# Patient Record
Sex: Female | Born: 1945
Health system: Southern US, Community
[De-identification: ages and names within clinical notes are randomized; demographics above are authoritative.]

## PROBLEM LIST (undated history)

## (undated) DIAGNOSIS — F419 Anxiety disorder, unspecified: Secondary | ICD-10-CM

## (undated) DIAGNOSIS — N289 Disorder of kidney and ureter, unspecified: Secondary | ICD-10-CM

## (undated) DIAGNOSIS — R112 Nausea with vomiting, unspecified: Secondary | ICD-10-CM

## (undated) DIAGNOSIS — F319 Bipolar disorder, unspecified: Secondary | ICD-10-CM

## (undated) DIAGNOSIS — H209 Unspecified iridocyclitis: Secondary | ICD-10-CM

## (undated) DIAGNOSIS — M810 Age-related osteoporosis without current pathological fracture: Secondary | ICD-10-CM

## (undated) DIAGNOSIS — R5382 Chronic fatigue, unspecified: Secondary | ICD-10-CM

## (undated) DIAGNOSIS — R51 Headache: Secondary | ICD-10-CM

## (undated) DIAGNOSIS — K219 Gastro-esophageal reflux disease without esophagitis: Secondary | ICD-10-CM

## (undated) DIAGNOSIS — G9332 Myalgic encephalomyelitis/chronic fatigue syndrome: Secondary | ICD-10-CM

## (undated) DIAGNOSIS — Z860100 Personal history of colon polyps, unspecified: Secondary | ICD-10-CM

## (undated) DIAGNOSIS — Z9889 Other specified postprocedural states: Secondary | ICD-10-CM

## (undated) DIAGNOSIS — E785 Hyperlipidemia, unspecified: Secondary | ICD-10-CM

## (undated) DIAGNOSIS — K5909 Other constipation: Secondary | ICD-10-CM

## (undated) DIAGNOSIS — I2510 Atherosclerotic heart disease of native coronary artery without angina pectoris: Secondary | ICD-10-CM

## (undated) DIAGNOSIS — I639 Cerebral infarction, unspecified: Secondary | ICD-10-CM

## (undated) DIAGNOSIS — M48061 Spinal stenosis, lumbar region without neurogenic claudication: Secondary | ICD-10-CM

## (undated) DIAGNOSIS — I10 Essential (primary) hypertension: Secondary | ICD-10-CM

## (undated) DIAGNOSIS — M1990 Unspecified osteoarthritis, unspecified site: Secondary | ICD-10-CM

## (undated) HISTORY — PX: CARDIAC CATHETERIZATION: SHX172

## (undated) HISTORY — PX: CATARACT EXTRACTION: SUR2

## (undated) HISTORY — PX: BACK SURGERY: SHX140

## (undated) HISTORY — PX: CORONARY ANGIOPLASTY WITH STENT PLACEMENT: SHX49

## (undated) HISTORY — PX: OTHER SURGICAL HISTORY: SHX169

## (undated) HISTORY — PX: ACHILLES TENDON REPAIR: SUR1153

## (undated) HISTORY — PX: TRIGGER FINGER RELEASE: SHX641

## (undated) HISTORY — PX: ABDOMINAL HYSTERECTOMY: SHX81

## (undated) HISTORY — PX: FRACTURE SURGERY: SHX138

## (undated) HISTORY — PX: HEMORRHOID SURGERY: SHX153

---

## 2004-01-08 ENCOUNTER — Ambulatory Visit: Payer: Self-pay | Admitting: Internal Medicine

## 2004-03-22 ENCOUNTER — Emergency Department: Payer: Self-pay | Admitting: Emergency Medicine

## 2004-06-23 ENCOUNTER — Ambulatory Visit: Payer: Self-pay | Admitting: Unknown Physician Specialty

## 2005-04-22 ENCOUNTER — Ambulatory Visit: Payer: Self-pay | Admitting: Internal Medicine

## 2005-12-12 ENCOUNTER — Ambulatory Visit: Payer: Self-pay

## 2006-02-16 ENCOUNTER — Ambulatory Visit: Payer: Self-pay | Admitting: Unknown Physician Specialty

## 2006-04-26 ENCOUNTER — Ambulatory Visit: Payer: Self-pay | Admitting: Internal Medicine

## 2006-05-10 ENCOUNTER — Ambulatory Visit: Payer: Self-pay | Admitting: Unknown Physician Specialty

## 2006-09-21 ENCOUNTER — Inpatient Hospital Stay: Payer: Self-pay | Admitting: Internal Medicine

## 2006-09-21 ENCOUNTER — Other Ambulatory Visit: Payer: Self-pay

## 2006-12-31 ENCOUNTER — Emergency Department: Payer: Self-pay | Admitting: Emergency Medicine

## 2007-02-06 ENCOUNTER — Ambulatory Visit: Payer: Self-pay

## 2007-04-30 ENCOUNTER — Ambulatory Visit: Payer: Self-pay | Admitting: Internal Medicine

## 2007-05-03 ENCOUNTER — Ambulatory Visit: Payer: Self-pay | Admitting: Unknown Physician Specialty

## 2007-06-25 ENCOUNTER — Ambulatory Visit: Payer: Self-pay | Admitting: Unknown Physician Specialty

## 2007-10-02 ENCOUNTER — Ambulatory Visit: Payer: Self-pay | Admitting: Internal Medicine

## 2007-10-04 ENCOUNTER — Ambulatory Visit: Payer: Self-pay | Admitting: Internal Medicine

## 2007-10-31 ENCOUNTER — Ambulatory Visit: Payer: Self-pay | Admitting: Internal Medicine

## 2008-05-01 ENCOUNTER — Ambulatory Visit: Payer: Self-pay | Admitting: Internal Medicine

## 2008-11-08 ENCOUNTER — Ambulatory Visit: Payer: Self-pay | Admitting: Internal Medicine

## 2009-02-18 ENCOUNTER — Ambulatory Visit: Payer: Self-pay | Admitting: Unknown Physician Specialty

## 2009-05-05 ENCOUNTER — Ambulatory Visit: Payer: Self-pay | Admitting: Internal Medicine

## 2009-05-07 ENCOUNTER — Ambulatory Visit: Payer: Self-pay | Admitting: Internal Medicine

## 2010-03-28 ENCOUNTER — Ambulatory Visit: Payer: Self-pay | Admitting: Rheumatology

## 2010-05-12 ENCOUNTER — Ambulatory Visit: Payer: Self-pay | Admitting: Internal Medicine

## 2011-04-03 ENCOUNTER — Inpatient Hospital Stay: Payer: Self-pay | Admitting: *Deleted

## 2011-04-03 LAB — CBC
HCT: 37.7 % (ref 35.0–47.0)
HGB: 12.9 g/dL (ref 12.0–16.0)
MCHC: 34.2 g/dL (ref 32.0–36.0)
Platelet: 203 10*3/uL (ref 150–440)
RDW: 14.4 % (ref 11.5–14.5)

## 2011-04-03 LAB — COMPREHENSIVE METABOLIC PANEL
Albumin: 3.5 g/dL (ref 3.4–5.0)
Anion Gap: 13 (ref 7–16)
Bilirubin,Total: 0.5 mg/dL (ref 0.2–1.0)
Co2: 22 mmol/L (ref 21–32)
Creatinine: 1.57 mg/dL — ABNORMAL HIGH (ref 0.60–1.30)
EGFR (African American): 42 — ABNORMAL LOW
Potassium: 3.6 mmol/L (ref 3.5–5.1)
SGOT(AST): 32 U/L (ref 15–37)
SGPT (ALT): 20 U/L
Sodium: 142 mmol/L (ref 136–145)
Total Protein: 7.2 g/dL (ref 6.4–8.2)

## 2011-04-03 LAB — URINALYSIS, COMPLETE
Bilirubin,UR: NEGATIVE
Blood: NEGATIVE
Glucose,UR: NEGATIVE mg/dL (ref 0–75)
Nitrite: NEGATIVE
RBC,UR: 3 /HPF (ref 0–5)
Specific Gravity: 1.02 (ref 1.003–1.030)
WBC UR: 1 /HPF (ref 0–5)

## 2011-04-03 LAB — TROPONIN I: Troponin-I: <0.02 ng/mL

## 2011-04-04 LAB — BASIC METABOLIC PANEL
Anion Gap: 10 (ref 7–16)
Calcium, Total: 8.1 mg/dL — ABNORMAL LOW (ref 8.5–10.1)
Co2: 24 mmol/L (ref 21–32)
Sodium: 143 mmol/L (ref 136–145)

## 2011-04-04 LAB — CBC WITH DIFFERENTIAL/PLATELET
Basophil #: 0 10*3/uL (ref 0.0–0.1)
Basophil %: 0.5 %
Eosinophil #: 0 10*3/uL (ref 0.0–0.7)
Eosinophil %: 0.6 %
Lymphocyte #: 2.2 10*3/uL (ref 1.0–3.6)
Lymphocyte %: 48.5 %
MCV: 92 fL (ref 80–100)
Monocyte %: 12.3 %
Neutrophil #: 1.7 10*3/uL (ref 1.4–6.5)
Neutrophil %: 38.1 %
Platelet: 172 10*3/uL (ref 150–440)
RDW: 14.7 % — ABNORMAL HIGH (ref 11.5–14.5)

## 2011-04-05 LAB — BASIC METABOLIC PANEL
BUN: 18 mg/dL (ref 7–18)
Calcium, Total: 8.2 mg/dL — ABNORMAL LOW (ref 8.5–10.1)
Chloride: 112 mmol/L — ABNORMAL HIGH (ref 98–107)
Co2: 21 mmol/L (ref 21–32)
Creatinine: 1.14 mg/dL (ref 0.60–1.30)
Osmolality: 290 (ref 275–301)
Sodium: 145 mmol/L (ref 136–145)

## 2011-04-05 LAB — URINE CULTURE

## 2011-06-02 ENCOUNTER — Ambulatory Visit: Payer: Self-pay | Admitting: Internal Medicine

## 2011-11-11 ENCOUNTER — Ambulatory Visit: Payer: Self-pay | Admitting: Orthopedic Surgery

## 2012-02-23 ENCOUNTER — Other Ambulatory Visit: Payer: Self-pay | Admitting: Neurosurgery

## 2012-03-06 NOTE — Progress Notes (Addendum)
Anesthesia chart review: Patient is a 67 year old female scheduled for L5-S1 PLIF by Dr. Gerlene Fee on 03/15/2012.  Her PAT visit is scheduled for 03/08/12.   Cardiologist is Dr. Arnoldo Hooker at [facility]) who cleared patient for this procedure.  Currently available cardiology records indicate her history includes CAD s/p proximal RCA stent '08, HTN, HLD, GERD, OA, RLS.  EKG on 04/03/11 [facility]) showed NSR, low voltage QRS, cannot rule out anterior infarct (age undetermined).  Echo on 04/04/11 [facility]) showed normal left ventricular systolic function, EF 55%. Left atrium is mildly dilated. Right atrium is mildly dilated. Mild mitral regurgitation. Mild tricuspid regurgitation.   Exercise Treadmill Test [facility]) on 05/21/09 showed indeterminate treadmill EKG due to baseline EKG changes. Average exercise tolerance for age. Normal LV systolic function with EF of 69%. Normal myocardial perfusion without evidence of myocardial ischemia.    Cardiac cath on 09/21/06 [facility]) showed proximal LAD 20%, proximal RCA 70% s/p PTCA/BMS, normal LV function.  Additional preoperative testing results such as CXR and labs are pending her PAT visit. (Update 03/08/12 1450: Results noted.)  Shonna Chock, PA-C 03/06/12 1320

## 2012-03-08 ENCOUNTER — Ambulatory Visit (HOSPITAL_COMMUNITY)
Admission: RE | Admit: 2012-03-08 | Discharge: 2012-03-08 | Disposition: A | Discharge status: Home / Self Care | Payer: Medicare Other | Source: Ambulatory Visit | Attending: Anesthesiology | Admitting: Anesthesiology

## 2012-03-08 ENCOUNTER — Encounter (HOSPITAL_COMMUNITY)
Admission: RE | Admit: 2012-03-08 | Discharge: 2012-03-08 | Disposition: A | Discharge status: Home / Self Care | Payer: Medicare Other | Source: Ambulatory Visit | Attending: Neurosurgery | Admitting: Neurosurgery

## 2012-03-08 ENCOUNTER — Encounter (HOSPITAL_COMMUNITY): Payer: Self-pay

## 2012-03-08 DIAGNOSIS — Z01818 Encounter for other preprocedural examination: Secondary | ICD-10-CM | POA: Insufficient documentation

## 2012-03-08 DIAGNOSIS — M48061 Spinal stenosis, lumbar region without neurogenic claudication: Secondary | ICD-10-CM | POA: Insufficient documentation

## 2012-03-08 HISTORY — DX: Hyperlipidemia, unspecified: E78.5

## 2012-03-08 HISTORY — DX: Headache: R51

## 2012-03-08 HISTORY — DX: Other specified postprocedural states: R11.2

## 2012-03-08 HISTORY — DX: Unspecified osteoarthritis, unspecified site: M19.90

## 2012-03-08 HISTORY — DX: Gastro-esophageal reflux disease without esophagitis: K21.9

## 2012-03-08 HISTORY — DX: Anxiety disorder, unspecified: F41.9

## 2012-03-08 HISTORY — DX: Other specified postprocedural states: Z98.890

## 2012-03-08 HISTORY — DX: Atherosclerotic heart disease of native coronary artery without angina pectoris: I25.10

## 2012-03-08 LAB — CBC
HCT: 39.7 % (ref 36.0–46.0)
Hemoglobin: 13.9 g/dL (ref 12.0–15.0)
MCH: 30.5 pg (ref 26.0–34.0)
MCV: 87.1 fL (ref 78.0–100.0)
RBC: 4.56 MIL/uL (ref 3.87–5.11)
WBC: 8.6 10*3/uL (ref 4.0–10.5)

## 2012-03-08 LAB — BASIC METABOLIC PANEL
CO2: 25 mEq/L (ref 19–32)
Calcium: 10.5 mg/dL (ref 8.4–10.5)
Chloride: 105 mEq/L (ref 96–112)
Glucose, Bld: 96 mg/dL (ref 70–99)
Sodium: 141 mEq/L (ref 135–145)

## 2012-03-08 LAB — TYPE AND SCREEN

## 2012-03-08 LAB — ABO/RH: ABO/RH(D): O POS

## 2012-03-08 LAB — SURGICAL PCR SCREEN: Staphylococcus aureus: NEGATIVE

## 2012-03-08 NOTE — Pre-Procedure Instructions (Signed)
Maebelle Sulton  03/08/2012   Your procedure is scheduled on: 03-15-2912  Report to [facility] at 5:30 AM.  Call this number if you have problems the morning of surgery: [phone]   Remember:   Do not eat food or drink liquids after midnight.   Take these medicines the morning of surgery with A SIP OF WATER: Citalopram(celexa),pain medication as needed,omeprazole(Prilosec)   Do not wear jewelry, make-up or nail polish.  Do not wear lotions, powders, or perfumes. You may wear deodorant.  Do not shave 48 hours prior to surgery. Men may shave face and neck.  Do not bring valuables to the hospital.  Contacts, dentures or bridgework may not be worn into surgery.  Leave suitcase in the car. After surgery it may be brought to your room.   For patients admitted to the hospital, checkout time is 11:00 AM the day of  discharge.   Patients discharged the day of surgery will not be allowed to drive  Home.    Special Instructions: Shower using CHG 2 nights before surgery and the night before surgery.  If you shower the day of surgery use CHG.  Use special wash - you have one bottle of CHG for all showers.  You should use approximately 1/3 of the bottle for each shower.     Please read over the following fact sheets that you were given: Pain Booklet, Coughing and Deep Breathing, Blood Transfusion Information and Surgical Site Infection Prevention

## 2012-03-15 ENCOUNTER — Inpatient Hospital Stay (HOSPITAL_COMMUNITY): Payer: Medicare Other | Admitting: *Deleted

## 2012-03-15 ENCOUNTER — Encounter (HOSPITAL_COMMUNITY): Payer: Self-pay | Admitting: Vascular Surgery

## 2012-03-15 ENCOUNTER — Inpatient Hospital Stay (HOSPITAL_COMMUNITY)
Admission: RE | Admit: 2012-03-15 | Discharge: 2012-03-16 | DRG: 460 | Disposition: A | Discharge status: Home / Self Care | Payer: Medicare Other | Source: Ambulatory Visit | Attending: Neurosurgery | Admitting: Neurosurgery

## 2012-03-15 ENCOUNTER — Encounter (HOSPITAL_COMMUNITY)
Admission: RE | Disposition: A | Discharge status: Home / Self Care | Payer: Self-pay | Source: Ambulatory Visit | Attending: Neurosurgery

## 2012-03-15 ENCOUNTER — Encounter (HOSPITAL_COMMUNITY): Payer: Self-pay | Admitting: *Deleted

## 2012-03-15 ENCOUNTER — Inpatient Hospital Stay (HOSPITAL_COMMUNITY): Payer: Medicare Other

## 2012-03-15 DIAGNOSIS — M5137 Other intervertebral disc degeneration, lumbosacral region: Principal | ICD-10-CM | POA: Diagnosis present

## 2012-03-15 DIAGNOSIS — Z87891 Personal history of nicotine dependence: Secondary | ICD-10-CM

## 2012-03-15 DIAGNOSIS — M51379 Other intervertebral disc degeneration, lumbosacral region without mention of lumbar back pain or lower extremity pain: Principal | ICD-10-CM | POA: Diagnosis present

## 2012-03-15 DIAGNOSIS — M5126 Other intervertebral disc displacement, lumbar region: Secondary | ICD-10-CM | POA: Diagnosis present

## 2012-03-15 DIAGNOSIS — I2510 Atherosclerotic heart disease of native coronary artery without angina pectoris: Secondary | ICD-10-CM | POA: Diagnosis present

## 2012-03-15 DIAGNOSIS — M48061 Spinal stenosis, lumbar region without neurogenic claudication: Secondary | ICD-10-CM

## 2012-03-15 DIAGNOSIS — E785 Hyperlipidemia, unspecified: Secondary | ICD-10-CM | POA: Diagnosis present

## 2012-03-15 DIAGNOSIS — Z9861 Coronary angioplasty status: Secondary | ICD-10-CM

## 2012-03-15 DIAGNOSIS — K219 Gastro-esophageal reflux disease without esophagitis: Secondary | ICD-10-CM | POA: Diagnosis present

## 2012-03-15 DIAGNOSIS — Q762 Congenital spondylolisthesis: Secondary | ICD-10-CM

## 2012-03-15 DIAGNOSIS — F411 Generalized anxiety disorder: Secondary | ICD-10-CM | POA: Diagnosis present

## 2012-03-15 DIAGNOSIS — Z79899 Other long term (current) drug therapy: Secondary | ICD-10-CM

## 2012-03-15 SURGERY — POSTERIOR LUMBAR FUSION 1 LEVEL
Anesthesia: General | Site: Back | Wound class: Clean

## 2012-03-15 MED ORDER — METOCLOPRAMIDE HCL 5 MG/ML IJ SOLN: 10.0000 mg | Freq: Once | INTRAMUSCULAR | Status: DC | PRN

## 2012-03-15 MED ORDER — SCOPOLAMINE 1 MG/3DAYS TD PT72
MEDICATED_PATCH | TRANSDERMAL | Status: AC
  Administered 2012-03-15: 1 via TRANSDERMAL
  Filled 2012-03-15: qty 1

## 2012-03-15 MED ORDER — HYDROMORPHONE HCL PF 1 MG/ML IJ SOLN
INTRAMUSCULAR | Status: AC
  Filled 2012-03-15: qty 1

## 2012-03-15 MED ORDER — BUPIVACAINE HCL (PF) 0.5 % IJ SOLN
INTRAMUSCULAR | Status: DC | PRN
  Administered 2012-03-15: 20 mL

## 2012-03-15 MED ORDER — SODIUM CHLORIDE 0.9 % IR SOLN
Status: DC | PRN
  Administered 2012-03-15: 10:00:00

## 2012-03-15 MED ORDER — CEFAZOLIN SODIUM-DEXTROSE 2-3 GM-% IV SOLR: 2.0000 g | INTRAVENOUS | Status: DC

## 2012-03-15 MED ORDER — MIDAZOLAM HCL 5 MG/5ML IJ SOLN
INTRAMUSCULAR | Status: DC | PRN
  Administered 2012-03-15: 2 mg via INTRAVENOUS

## 2012-03-15 MED ORDER — NEOSTIGMINE METHYLSULFATE 1 MG/ML IJ SOLN
INTRAMUSCULAR | Status: DC | PRN
  Administered 2012-03-15: 5 mg via INTRAVENOUS

## 2012-03-15 MED ORDER — VANCOMYCIN HCL IN DEXTROSE 1-5 GM/200ML-% IV SOLN
1000.0000 mg | Freq: Once | INTRAVENOUS | Status: AC
  Administered 2012-03-15: 1000 mg via INTRAVENOUS
  Filled 2012-03-15: qty 200

## 2012-03-15 MED ORDER — PROPOFOL 10 MG/ML IV BOLUS
INTRAVENOUS | Status: DC | PRN
  Administered 2012-03-15: 180 mg via INTRAVENOUS

## 2012-03-15 MED ORDER — 0.9 % SODIUM CHLORIDE (POUR BTL) OPTIME
TOPICAL | Status: DC | PRN
  Administered 2012-03-15: 1000 mL

## 2012-03-15 MED ORDER — OXYCODONE HCL 5 MG PO TABS: 5.0000 mg | ORAL_TABLET | Freq: Once | ORAL | Status: DC | PRN

## 2012-03-15 MED ORDER — ONDANSETRON HCL 4 MG/2ML IJ SOLN: 4.0000 mg | INTRAMUSCULAR | Status: DC | PRN

## 2012-03-15 MED ORDER — SODIUM CHLORIDE 0.9 % IJ SOLN: 3.0000 mL | INTRAMUSCULAR | Status: DC | PRN

## 2012-03-15 MED ORDER — LACTATED RINGERS IV SOLN
INTRAVENOUS | Status: DC | PRN
  Administered 2012-03-15 (×3): via INTRAVENOUS

## 2012-03-15 MED ORDER — PHENOL 1.4 % MT LIQD: 1.0000 | OROMUCOSAL | Status: DC | PRN

## 2012-03-15 MED ORDER — SUFENTANIL CITRATE 50 MCG/ML IV SOLN
INTRAVENOUS | Status: DC | PRN
  Administered 2012-03-15 (×5): 10 ug via INTRAVENOUS

## 2012-03-15 MED ORDER — DEXAMETHASONE 4 MG PO TABS
4.0000 mg | ORAL_TABLET | Freq: Four times a day (QID) | ORAL | Status: AC
  Administered 2012-03-15 – 2012-03-16 (×2): 4 mg via ORAL
  Filled 2012-03-15 (×2): qty 1

## 2012-03-15 MED ORDER — ONDANSETRON HCL 4 MG/2ML IJ SOLN
INTRAMUSCULAR | Status: DC | PRN
  Administered 2012-03-15: 4 mg via INTRAVENOUS

## 2012-03-15 MED ORDER — LIDOCAINE HCL (CARDIAC) 20 MG/ML IV SOLN
INTRAVENOUS | Status: DC | PRN
  Administered 2012-03-15: 100 mg via INTRAVENOUS

## 2012-03-15 MED ORDER — EPHEDRINE SULFATE 50 MG/ML IJ SOLN
INTRAMUSCULAR | Status: DC | PRN
  Administered 2012-03-15: 10 mg via INTRAVENOUS

## 2012-03-15 MED ORDER — DEXAMETHASONE SODIUM PHOSPHATE 10 MG/ML IJ SOLN
INTRAMUSCULAR | Status: DC | PRN
  Administered 2012-03-15: 10 mg via INTRAVENOUS

## 2012-03-15 MED ORDER — HYDROCODONE-ACETAMINOPHEN 5-325 MG PO TABS
1.0000 | ORAL_TABLET | ORAL | Status: DC | PRN
  Administered 2012-03-15: 2 via ORAL
  Administered 2012-03-16: 1 via ORAL
  Administered 2012-03-16 (×2): 2 via ORAL
  Filled 2012-03-15 (×4): qty 2

## 2012-03-15 MED ORDER — METHOCARBAMOL 500 MG PO TABS: 500.0000 mg | ORAL_TABLET | Freq: Four times a day (QID) | ORAL | Status: DC | PRN

## 2012-03-15 MED ORDER — MENTHOL 3 MG MT LOZG: 1.0000 | LOZENGE | OROMUCOSAL | Status: DC | PRN

## 2012-03-15 MED ORDER — ACETAMINOPHEN 325 MG PO TABS: 650.0000 mg | ORAL_TABLET | ORAL | Status: DC | PRN

## 2012-03-15 MED ORDER — SODIUM CHLORIDE 0.9 % IV SOLN: 250.0000 mL | INTRAVENOUS | Status: DC

## 2012-03-15 MED ORDER — KCL IN DEXTROSE-NACL 20-5-0.45 MEQ/L-%-% IV SOLN
80.0000 mL/h | INTRAVENOUS | Status: DC
  Administered 2012-03-15: 80 mL/h via INTRAVENOUS
  Filled 2012-03-15 (×3): qty 1000

## 2012-03-15 MED ORDER — SODIUM CHLORIDE 0.9 % IV SOLN
INTRAVENOUS | Status: AC
  Filled 2012-03-15: qty 500

## 2012-03-15 MED ORDER — ACETAMINOPHEN 650 MG RE SUPP: 650.0000 mg | RECTAL | Status: DC | PRN

## 2012-03-15 MED ORDER — CITALOPRAM HYDROBROMIDE 20 MG PO TABS
20.0000 mg | ORAL_TABLET | Freq: Every day | ORAL | Status: DC
  Administered 2012-03-15 – 2012-03-16 (×2): 20 mg via ORAL
  Filled 2012-03-15 (×2): qty 1

## 2012-03-15 MED ORDER — METHOCARBAMOL 100 MG/ML IJ SOLN: 500.0000 mg | Freq: Four times a day (QID) | INTRAVENOUS | Status: DC | PRN

## 2012-03-15 MED ORDER — PANTOPRAZOLE SODIUM 40 MG IV SOLR
40.0000 mg | Freq: Every day | INTRAVENOUS | Status: DC
  Administered 2012-03-15: 40 mg via INTRAVENOUS
  Filled 2012-03-15 (×3): qty 40

## 2012-03-15 MED ORDER — GLYCOPYRROLATE 0.2 MG/ML IJ SOLN
INTRAMUSCULAR | Status: DC | PRN
  Administered 2012-03-15: 0.6 mg via INTRAVENOUS

## 2012-03-15 MED ORDER — ROCURONIUM BROMIDE 100 MG/10ML IV SOLN
INTRAVENOUS | Status: DC | PRN
  Administered 2012-03-15: 50 mg via INTRAVENOUS

## 2012-03-15 MED ORDER — METOCLOPRAMIDE HCL 5 MG/ML IJ SOLN
INTRAMUSCULAR | Status: DC | PRN
  Administered 2012-03-15: 10 mg via INTRAVENOUS

## 2012-03-15 MED ORDER — HYDROMORPHONE HCL PF 1 MG/ML IJ SOLN
0.2500 mg | INTRAMUSCULAR | Status: DC | PRN
  Administered 2012-03-15 (×4): 0.5 mg via INTRAVENOUS

## 2012-03-15 MED ORDER — ZOLPIDEM TARTRATE 5 MG PO TABS: 5.0000 mg | ORAL_TABLET | Freq: Every evening | ORAL | Status: DC | PRN

## 2012-03-15 MED ORDER — VECURONIUM BROMIDE 10 MG IV SOLR
INTRAVENOUS | Status: DC | PRN
  Administered 2012-03-15: 2 mg via INTRAVENOUS
  Administered 2012-03-15: 1 mg via INTRAVENOUS

## 2012-03-15 MED ORDER — DEXAMETHASONE SODIUM PHOSPHATE 4 MG/ML IJ SOLN: 4.0000 mg | Freq: Four times a day (QID) | INTRAMUSCULAR | Status: AC

## 2012-03-15 MED ORDER — SODIUM CHLORIDE 0.9 % IJ SOLN: 3.0000 mL | Freq: Two times a day (BID) | INTRAMUSCULAR | Status: DC

## 2012-03-15 MED ORDER — THROMBIN 20000 UNITS EX SOLR
CUTANEOUS | Status: DC | PRN
  Administered 2012-03-15: 10:00:00 via TOPICAL

## 2012-03-15 MED ORDER — SIMVASTATIN 5 MG PO TABS
5.0000 mg | ORAL_TABLET | Freq: Every day | ORAL | Status: DC
  Filled 2012-03-15: qty 1

## 2012-03-15 MED ORDER — OXYCODONE HCL 5 MG/5ML PO SOLN: 5.0000 mg | Freq: Once | ORAL | Status: DC | PRN

## 2012-03-15 MED ORDER — HYDROMORPHONE HCL PF 1 MG/ML IJ SOLN
1.0000 mg | INTRAMUSCULAR | Status: DC | PRN
  Filled 2012-03-15: qty 1

## 2012-03-15 MED ORDER — BACITRACIN 50000 UNITS IM SOLR
INTRAMUSCULAR | Status: AC
  Filled 2012-03-15: qty 1

## 2012-03-15 MED ORDER — HEMOSTATIC AGENTS (NO CHARGE) OPTIME
TOPICAL | Status: DC | PRN
  Administered 2012-03-15: 1 via TOPICAL

## 2012-03-15 SURGICAL SUPPLY — 66 items
BAG DECANTER FOR FLEXI CONT (MISCELLANEOUS) ×2 IMPLANT
BENZOIN TINCTURE PRP APPL 2/3 (GAUZE/BANDAGES/DRESSINGS) ×2 IMPLANT
BLADE SURG ROTATE 9660 (MISCELLANEOUS) IMPLANT
BONE EQUIVA 10CC (Bone Implant) ×2 IMPLANT
BRUSH SCRUB EZ PLAIN DRY (MISCELLANEOUS) ×2 IMPLANT
BUR CUTTER 7.0 ROUND (BURR) ×2 IMPLANT
BUR MATCHSTICK NEURO 3.0 LAGG (BURR) ×2 IMPLANT
CAGE TLIF ARDIS 9X9X22 (Cage) ×4 IMPLANT
CANISTER SUCTION 2500CC (MISCELLANEOUS) ×2 IMPLANT
CLOTH BEACON ORANGE TIMEOUT ST (SAFETY) ×2 IMPLANT
CONT SPEC 4OZ CLIKSEAL STRL BL (MISCELLANEOUS) ×4 IMPLANT
COVER BACK TABLE 24X17X13 BIG (DRAPES) IMPLANT
COVER TABLE BACK 60X90 (DRAPES) ×2 IMPLANT
DERMABOND ADVANCED (GAUZE/BANDAGES/DRESSINGS) ×2
DERMABOND ADVANCED .7 DNX12 (GAUZE/BANDAGES/DRESSINGS) ×2 IMPLANT
DRAPE C-ARM 42X72 X-RAY (DRAPES) ×4 IMPLANT
DRAPE LAPAROTOMY 100X72X124 (DRAPES) ×2 IMPLANT
DRAPE SURG 17X23 STRL (DRAPES) ×4 IMPLANT
DRESSING TELFA 8X3 (GAUZE/BANDAGES/DRESSINGS) ×2 IMPLANT
ELECT CAUTERY BLADE 6.4 (BLADE) ×2 IMPLANT
ELECT REM PT RETURN 9FT ADLT (ELECTROSURGICAL) ×2
ELECTRODE REM PT RTRN 9FT ADLT (ELECTROSURGICAL) ×1 IMPLANT
EVACUATOR 1/8 PVC DRAIN (DRAIN) ×2 IMPLANT
GAUZE SPONGE 4X4 16PLY XRAY LF (GAUZE/BANDAGES/DRESSINGS) IMPLANT
GLOVE BIO SURGEON STRL SZ8.5 (GLOVE) ×2 IMPLANT
GLOVE BIOGEL PI IND STRL 6.5 (GLOVE) ×3 IMPLANT
GLOVE BIOGEL PI IND STRL 7.5 (GLOVE) ×1 IMPLANT
GLOVE BIOGEL PI INDICATOR 6.5 (GLOVE) ×3
GLOVE BIOGEL PI INDICATOR 7.5 (GLOVE) ×1
GLOVE ECLIPSE 7.5 STRL STRAW (GLOVE) ×8 IMPLANT
GLOVE EXAM NITRILE LRG STRL (GLOVE) ×4 IMPLANT
GLOVE EXAM NITRILE MD LF STRL (GLOVE) IMPLANT
GLOVE EXAM NITRILE XS STR PU (GLOVE) IMPLANT
GLOVE INDICATOR 7.0 STRL GRN (GLOVE) ×2 IMPLANT
GLOVE SS BIOGEL STRL SZ 8 (GLOVE) ×1 IMPLANT
GLOVE SUPERSENSE BIOGEL SZ 8 (GLOVE) ×1
GOWN BRE IMP SLV AUR LG STRL (GOWN DISPOSABLE) IMPLANT
GOWN BRE IMP SLV AUR XL STRL (GOWN DISPOSABLE) ×8 IMPLANT
GOWN STRL REIN 2XL LVL4 (GOWN DISPOSABLE) IMPLANT
KIT BASIN OR (CUSTOM PROCEDURE TRAY) ×2 IMPLANT
KIT ROOM TURNOVER OR (KITS) ×2 IMPLANT
MILL MEDIUM DISP (BLADE) ×2 IMPLANT
NEEDLE HYPO 22GX1.5 SAFETY (NEEDLE) ×2 IMPLANT
NS IRRIG 1000ML POUR BTL (IV SOLUTION) ×2 IMPLANT
PACK LAMINECTOMY NEURO (CUSTOM PROCEDURE TRAY) ×2 IMPLANT
PAD ARMBOARD 7.5X6 YLW CONV (MISCELLANEOUS) ×6 IMPLANT
PATTIES SURGICAL .75X.75 (GAUZE/BANDAGES/DRESSINGS) IMPLANT
PENCIL BUTTON HOLSTER BLD 10FT (ELECTRODE) ×2 IMPLANT
ROD PER BENT 35MM (Rod) ×4 IMPLANT
SCREW POLY 6.5X40MM (Screw) ×6 IMPLANT
SCREW SEQUOIA 6.5X35MM (Screw) ×4 IMPLANT
SPONGE GAUZE 4X4 12PLY (GAUZE/BANDAGES/DRESSINGS) ×2 IMPLANT
SPONGE LAP 4X18 X RAY DECT (DISPOSABLE) ×2 IMPLANT
SPONGE SURGIFOAM ABS GEL 100 (HEMOSTASIS) ×2 IMPLANT
STRIP CLOSURE SKIN 1/2X4 (GAUZE/BANDAGES/DRESSINGS) ×4 IMPLANT
SUT VIC AB 0 CT1 18XCR BRD8 (SUTURE) ×1 IMPLANT
SUT VIC AB 0 CT1 8-18 (SUTURE) ×1
SUT VIC AB 2-0 OS6 18 (SUTURE) ×6 IMPLANT
SUT VIC AB 3-0 CP2 18 (SUTURE) ×2 IMPLANT
SYR 20ML ECCENTRIC (SYRINGE) ×4 IMPLANT
TOP CLSR SEQUOIA (Orthopedic Implant) ×8 IMPLANT
TOWEL OR 17X24 6PK STRL BLUE (TOWEL DISPOSABLE) ×2 IMPLANT
TOWEL OR 17X26 10 PK STRL BLUE (TOWEL DISPOSABLE) ×2 IMPLANT
TRAP SPECIMEN MUCOUS 40CC (MISCELLANEOUS) ×2 IMPLANT
TRAY FOLEY CATH 14FRSI W/METER (CATHETERS) ×2 IMPLANT
WATER STERILE IRR 1000ML POUR (IV SOLUTION) ×2 IMPLANT

## 2012-03-15 NOTE — H&P (Signed)
Madeline Cabrera is an 67 y.o. female.   Chief Complaint: Back and bilateral leg pain HPI: The patient is a 67 year old female with back and bilateral leg pain. She's tried aggressive conservative therapy which gave her no significant improvement. General imaging studies which shows spondylolisthesis and stenosis at L5-S1. After discussing the options the patient requested surgery and now comes for an L5-S1 posterior lumbar interbody fusion with pedicle screw fixation. I had a long discussion with her regarding the risks and benefits of surgical intervention. The risks discussed include but are not limited to bleeding infection weakness numbness paralysis trouble with instrumentation nonunion coma and death. We have discussed alternative methods of therapy along with the risks and benefits of nonintervention. Madeline Cabrera is had the opportunity numerous questions and appears to understand. With this information in hand she has requested we proceed with surgery.  Past Medical History  Diagnosis Date  . PONV (postoperative nausea and vomiting)   . Coronary artery disease   . Anxiety   . GERD (gastroesophageal reflux disease)   . Headache   . Arthritis   . Hyperlipidemia     Past Surgical History  Procedure Laterality Date  . Heel spurs Right   . Abdominal hysterectomy    . Trigger finger release Bilateral   . Cataract extraction Bilateral   . Cardiac catheterization    . Coronary angioplasty with stent placement      History reviewed. No pertinent family history. Social History:  reports that she quit smoking about 10 years ago. She does not have any smokeless tobacco history on file. She reports that she does not drink alcohol or use illicit drugs.  Allergies:  Allergies  Allergen Reactions  . Penicillins Rash    Medications Prior to Admission  Medication Sig Dispense Refill  . aspirin EC 81 MG tablet Take 81 mg by mouth daily.      . cholecalciferol (VITAMIN D) 1000 UNITS tablet  Take 1,000 Units by mouth daily.      . citalopram (CELEXA) 20 MG tablet Take 20 mg by mouth daily.      . DiphenhydrAMINE HCl (ALLERGY MEDICATION PO) Take 1 tablet by mouth every evening.       . estrogens, conjugated, (PREMARIN) 1.25 MG tablet Take 1.25 mg by mouth every 7 (seven) days. Patient uses this medication on Sunday nights.      Marland Kitchen HYDROcodone-acetaminophen (NORCO/VICODIN) 5-325 MG per tablet Take 0.5 tablets by mouth 3 (three) times daily as needed for pain.       . Hypromellose (GENTEAL OP) Apply 2 drops to eye 2 (two) times daily.      Marland Kitchen lovastatin (MEVACOR) 40 MG tablet Take 40 mg by mouth at bedtime.      Marland Kitchen omeprazole (PRILOSEC) 40 MG capsule Take 40 mg by mouth 2 (two) times daily.      Cliffton Asters Petrolatum-Mineral Oil (GENTEAL PM) 85-15 % OINT Apply 1 application to eye every evening.        No results found for this or any previous visit (from the past 48 hour(s)). No results found.  Review of systems not obtained due to patient factors.  Blood pressure 131/94, pulse 67, temperature 97.1 F (36.2 C), temperature source Oral, resp. rate 18, SpO2 100.00%.  The patient is awake alert and oriented. She is no facial asymmetry. Gait is slow but nonantalgic. Reflexes are decreased but equal. She is normal strength and sensation. Assessment/Plan Impression is that of spondylolisthesis and stenosis L5-S1. The plan is  for an L5-S1 posterior lumbar interbody fusion with pedicle screw fixation.  Reinaldo Meeker, MD 03/15/2012, 9:50 AM

## 2012-03-15 NOTE — Op Note (Signed)
Preop diagnosis: Degenerative disc disease and herniated disc with central spinal stenosis L5-S1 and foraminal collapse with foraminal stenosis with L5 and S1 nerve root compression Postop diagnosis: Same Procedure: Bilateral L5-S1 decompressive laminectomy for decompression of central stenosis Bilateral L5-S1 facetectomy for decompression of L5 and S1 nerve roots more so than needed for interbody fusion Bilateral L5-S1 microdiscectomy Posterior lumbar interbody fusion L5-S1 with peek interbody spacers Nonsegmental instrumentation L5-S1 with [place] pedicle screw system L5-S1 posterolateral fusion  Surgeon: Kritzer Assistant: Lovell Sheehan  After and placed the prone position the patient's back was prepped and draped in the usual sterile fashion. Localizing fluoroscopy was used prior to incision to identify the appropriate level. Midline incision was made above the spinous processes of L4-L5 and S1. Using Bovie cutting current the incision was carried on the spinous processes. Subperiosteal dissection was then carried out bilaterally on the spinous processes lamina facet joint and the far lateral region to identify the transverse process of L5 and the far lateral aspect of the S1 region. Self-retaining tract was placed for exposure and fluoroscopy showed approach the appropriate level. Spinous process of L5 and S1 removed. Starting the patient's left side generous laminotomy was performed removing the inferior 80% of the L5 lamina the medial three quarters of the facet joint the superior one third of the S1 segment. Residual bone and hypertrophic ligamentum flavum removed in a piecemeal fashion. L5 and S1 nerve roots were identified and decompressed on the side more so than needed for interbody fusion. We then decompressed the opposite side a similar fashion. We decompressed the far lateral region and did a generous facetectomy  to decompress the L5 nerve root as well as the S1 nerve root as 1 runs pedicle.  Were decompression was carried out there was the interbody fusion. This relieved the foraminal stenosis lateral recess stenosis well. At this time the disc space was entered and thoroughly cleaned out with pituitary rongeurs and curettes. It was then prepared for interbody fusion. He was distracted up to a 9 mm size which is felt to be a good fit. Appropriately sized peek interbody spacers were chosen and filled with a mixture of morselized bone and allograft. Was impacted the disc space without difficulty and fossae showed to be in good position. On the opposite side sore procedure was carried out with decortication of the endplates. We placed a mixture of morselized allograft of autologous bone deep within the interspace to help with interbody fusion then placed a second spacer. We then placed pedicle screws in standard fashion of L5 and S1 bilaterally. Which is standard entry point passed the pedicle all from lateral to medial direction through the pedicle and then tapped with a 5.5 mm tap. We placed 6.5 x 40 Miller screws at L5 and 6.5 x 35 Miller screws at S1 bilaterally. These were followed in excellent position. We then decorticated the far lateral region placed a mixture of autologous bone morselized allograft for posterolateral fusion. We then chose appropriate length rods and secured them to the top of the screws and did tightening and final tightening with torque and counter torque and compressed L5 on S1. Final fluoroscopy in AP lateral direction looked excellent. Irrigation was carried out and any bleeding control proper coagulation Gelfoam. Left an epidural drain in the epidural space and brought out through separate stab wound incision. The was then closed in multiple layers of Vicryl on the muscle fascia subcutaneous and subcuticular tissues. Dermabond Steri-Strips were placed on the skin. Shortness was then  applied the patient was extubated and taken to recovery in stable condition.

## 2012-03-15 NOTE — Preoperative (Signed)
Beta Blockers   Reason not to administer Beta Blockers:Not Applicable 

## 2012-03-15 NOTE — Transfer of Care (Signed)
Immediate Anesthesia Transfer of Care Note  Patient: Madeline Cabrera  Procedure(s) Performed: Procedure(s) with comments: POSTERIOR LUMBAR FUSION 1 LEVEL (N/A) - Lumbar five sacral one Posterior lumbar interbody fusion/Pedicle screws  Patient Location: PACU  Anesthesia Type:General  Level of Consciousness: awake  Airway & Oxygen Therapy: Patient Spontanous Breathing and Patient connected to face mask oxygen  Post-op Assessment: Report given to PACU RN and Post -op Vital signs reviewed and stable  Post vital signs: Reviewed and stable  Complications: No apparent anesthesia complications

## 2012-03-15 NOTE — Anesthesia Postprocedure Evaluation (Signed)
Anesthesia Post Note  Patient: Madeline Cabrera  Procedure(s) Performed: Procedure(s) (LRB): POSTERIOR LUMBAR FUSION 1 LEVEL (N/A)  Anesthesia type: general  Patient location: PACU  Post pain: Pain level controlled  Post assessment: Patient's Cardiovascular Status Stable  Last Vitals:  Filed Vitals:   03/15/12 1501  BP: 131/61  Pulse: 87  Temp:   Resp: 21    Post vital signs: Reviewed and stable  Level of consciousness: sedated  Complications: No apparent anesthesia complications

## 2012-03-15 NOTE — Anesthesia Preprocedure Evaluation (Addendum)
Anesthesia Evaluation  Patient identified by MRN, date of birth, ID band Patient awake    Reviewed: Allergy & Precautions, H&P , NPO status , Patient's Chart, lab work & pertinent test results, reviewed documented beta blocker date and time   History of Anesthesia Complications (+) PONV  Airway Mallampati: II TM Distance: >3 FB Neck ROM: full    Dental  (+) Teeth Intact and Dental Advisory Given   Pulmonary former smoker,  breath sounds clear to auscultation        Cardiovascular + CAD and + Cardiac Stents negative cardio ROS  Rhythm:regular     Neuro/Psych  Headaches, negative psych ROS   GI/Hepatic Neg liver ROS, GERD-  Medicated and Controlled,  Endo/Other  negative endocrine ROS  Renal/GU negative Renal ROS  negative genitourinary   Musculoskeletal   Abdominal   Peds  Hematology negative hematology ROS (+)   Anesthesia Other Findings See surgeon's H&P   Reproductive/Obstetrics negative OB ROS                          Anesthesia Physical Anesthesia Plan  ASA: III  Anesthesia Plan: General   Post-op Pain Management:    Induction: Intravenous  Airway Management Planned: Oral ETT  Additional Equipment:   Intra-op Plan:   Post-operative Plan: Extubation in OR  Informed Consent: I have reviewed the patients History and Physical, chart, labs and discussed the procedure including the risks, benefits and alternatives for the proposed anesthesia with the patient or authorized representative who has indicated his/her understanding and acceptance.   Dental Advisory Given and Dental advisory given  Plan Discussed with: CRNA, Surgeon and Anesthesiologist  Anesthesia Plan Comments:        Anesthesia Quick Evaluation

## 2012-03-15 NOTE — Anesthesia Procedure Notes (Signed)
Procedure Name: Intubation Date/Time: 03/15/2012 10:14 AM Performed by: Brien Mates DOBSON Pre-anesthesia Checklist: Patient identified, Emergency Drugs available, Suction available, Patient being monitored and Timeout performed Patient Re-evaluated:Patient Re-evaluated prior to inductionOxygen Delivery Method: Circle system utilized Preoxygenation: Pre-oxygenation with 100% oxygen Intubation Type: IV induction Ventilation: Mask ventilation without difficulty Laryngoscope Size: Miller and 2 Grade View: Grade I Tube type: Oral Tube size: 7.5 mm Number of attempts: 1 Airway Equipment and Method: Stylet Placement Confirmation: ETT inserted through vocal cords under direct vision,  breath sounds checked- equal and bilateral and positive ETCO2 Secured at: 22 (at lip) cm Tube secured with: Tape Dental Injury: Teeth and Oropharynx as per pre-operative assessment

## 2012-03-16 MED ORDER — HYDROCODONE-ACETAMINOPHEN 5-325 MG PO TABS: 1.0000 | ORAL_TABLET | ORAL | Status: DC | PRN

## 2012-03-16 MED ORDER — SENNA 8.6 MG PO TABS
1.0000 | ORAL_TABLET | Freq: Every day | ORAL | Status: DC | PRN
  Administered 2012-03-16: 8.6 mg via ORAL
  Filled 2012-03-16: qty 1

## 2012-03-16 NOTE — Discharge Summary (Signed)
Physician Discharge Summary  Patient ID: Madeline Cabrera MRN: [id number] DOB/AGE: 1945/03/10 67 y.o.  Admit date: 03/15/2012 Discharge date: 03/16/2012  Admission Diagnoses:  Discharge Diagnoses:  Active Problems:   * No active hospital problems. *   Discharged Condition: good  Hospital Course: Surgery Thursday with 1 level PLIF. Did well. Pain much better. Up without difficulty POD 1. Wound healing well. Home with specific instructions given.  Consults: None  Significant Diagnostic Studies: none  Treatments: surgery: L5 S1 plif  Discharge Exam: Blood pressure 116/53, pulse 76, temperature 97.6 F (36.4 C), temperature source Oral, resp. rate 20, height 5\' 5"  (1.651 m), weight 75 kg (165 lb 5.5 oz), SpO2 100.00%. Incision/Wound:clean and dry  Disposition: Final discharge disposition not confirmed  Discharge Orders   Future Orders Complete By Expires     Call MD for:  difficulty breathing, headache or visual disturbances  As directed     Call MD for:  hives  As directed     Call MD for:  persistant nausea and vomiting  As directed     Call MD for:  redness, tenderness, or signs of infection (pain, swelling, redness, odor or green/yellow discharge around incision site)  As directed     Call MD for:  severe uncontrolled pain  As directed     Call MD for:  temperature >100.4  As directed     Diet general  As directed     Discharge instructions  As directed     Comments:      Mostly bedrest. Get up 9 or 10 times each day and walk for 15-20 minutes each time. Very little sitting the first week. No riding in the car until your first post op appointment. If you had neck surgery...may shower from the chest down. If you had low back surgery....you may shower with a saran wrap covering over the incision. Take your pain medicine as needed...and other medicines that you are instructed to take. Call for an appointment...[phone].        Medication List    STOP taking these  medications       aspirin EC 81 MG tablet      TAKE these medications       ALLERGY MEDICATION PO  Take 1 tablet by mouth every evening.     cholecalciferol 1000 UNITS tablet  Commonly known as:  VITAMIN D  Take 1,000 Units by mouth daily.     citalopram 20 MG tablet  Commonly known as:  CELEXA  Take 20 mg by mouth daily.     estrogens (conjugated) 1.25 MG tablet  Commonly known as:  PREMARIN  Take 1.25 mg by mouth every 7 (seven) days. Patient uses this medication on Sunday nights.     GENTEAL OP  Apply 2 drops to eye 2 (two) times daily.     GenTeal PM 85-15 % Oint  Apply 1 application to eye every evening.     HYDROcodone-acetaminophen 5-325 MG per tablet  Commonly known as:  NORCO/VICODIN  Take 1-2 tablets by mouth every 4 (four) hours as needed.     HYDROcodone-acetaminophen 5-325 MG per tablet  Commonly known as:  NORCO/VICODIN  Take 0.5 tablets by mouth 3 (three) times daily as needed for pain.     lovastatin 40 MG tablet  Commonly known as:  MEVACOR  Take 40 mg by mouth at bedtime.     omeprazole 40 MG capsule  Commonly known as:  PRILOSEC  Take 40 mg by mouth  2 (two) times daily.         At home rest most of the time. Get up 9 or 10 times each day and take a 15 or 20 minute walk. No riding in the car and to your first postoperative appointment. If you have neck surgery you may shower from the chest down starting on the third postoperative day. If you had back surgery he may start showering on the third postoperative day with saran wrap wrapped around your incisional area 3 times. After the shower remove the saran wrap. Take pain medicine as needed and other medications as instructed. Call my office for an appointment.  SignedReinaldo Meeker, MD 03/16/2012, 11:31 AM

## 2012-06-06 ENCOUNTER — Ambulatory Visit: Payer: Self-pay | Admitting: Internal Medicine

## 2013-01-14 ENCOUNTER — Ambulatory Visit: Payer: Self-pay | Admitting: Unknown Physician Specialty

## 2013-05-17 ENCOUNTER — Ambulatory Visit: Payer: Self-pay | Admitting: Physical Medicine and Rehabilitation

## 2013-07-02 ENCOUNTER — Ambulatory Visit: Payer: Self-pay | Admitting: Internal Medicine

## 2013-07-24 ENCOUNTER — Ambulatory Visit: Payer: Self-pay | Admitting: Internal Medicine

## 2013-09-30 ENCOUNTER — Ambulatory Visit: Payer: Self-pay | Admitting: Specialist

## 2013-09-30 LAB — CBC WITH DIFFERENTIAL/PLATELET
Basophil #: 0.1 10*3/uL (ref 0.0–0.1)
Basophil %: 1.4 %
EOS ABS: 0.2 10*3/uL (ref 0.0–0.7)
Eosinophil %: 1.9 %
HCT: 41.5 % (ref 35.0–47.0)
HGB: 13 g/dL (ref 12.0–16.0)
LYMPHS ABS: 2.8 10*3/uL (ref 1.0–3.6)
Lymphocyte %: 31.2 %
MCH: 29.3 pg (ref 26.0–34.0)
MCHC: 31.4 g/dL — ABNORMAL LOW (ref 32.0–36.0)
MCV: 93 fL (ref 80–100)
MONO ABS: 0.7 x10 3/mm (ref 0.2–0.9)
MONOS PCT: 7.8 %
NEUTROS ABS: 5.3 10*3/uL (ref 1.4–6.5)
Neutrophil %: 57.7 %
Platelet: 249 10*3/uL (ref 150–440)
RBC: 4.45 10*6/uL (ref 3.80–5.20)
RDW: 13.8 % (ref 11.5–14.5)
WBC: 9.1 10*3/uL (ref 3.6–11.0)

## 2013-10-08 ENCOUNTER — Ambulatory Visit: Payer: Self-pay | Admitting: Specialist

## 2013-12-13 ENCOUNTER — Ambulatory Visit: Payer: Self-pay | Admitting: Specialist

## 2014-04-19 ENCOUNTER — Emergency Department
Admit: 2014-04-19 | Disposition: A | Discharge status: Home / Self Care | Payer: Self-pay | Admitting: Emergency Medicine

## 2014-04-26 NOTE — Op Note (Signed)
PATIENT NAME:  Madeline Cabrera, SIMAO MR#:  [id number] DATE OF BIRTH:  13-Sep-1945  DATE OF PROCEDURE:  10/08/2013  PREOPERATIVE DIAGNOSIS: Degenerative arthritis, carpometacarpal joint, base of left thumb.   POSTOPERATIVE DIAGNOSIS: Degenerative arthritis, carpometacarpal joint, base of left thumb.   PROCEDURE PERFORMED: Excisional trapezium arthroplasty, left thumb.   SURGEON: Christophe Louis, MD.   ANESTHESIA: General.   COMPLICATIONS: None.   TOURNIQUET TIME: 60 minutes.   DESCRIPTION OF PROCEDURE: Clindamycin 600 mg given intravenously prior to the procedure. General anesthesia is induced. The left upper extremity is thoroughly prepped with alcohol and ChloraPrep and draped in standard sterile fashion. The extremity is wrapped out with the Esmarch bandage and pneumatic tourniquet elevated to 250 mmHg. Under loupe magnification, a standard dorsal incision is made over the carpometacarpal joint of the thumb. The dissection is carefully carried down to the dorsal joint capsule and the tendon at the base and the cutaneous nerves are reflected to each side and protected throughout the case. The dorsal capsule is then incised longitudinally and reflected off the base of the first metacarpal and preserved for later closure. A knife is then used to cut the capsule surrounding the trapezium. The bur is used to remove the central portion of the trapezium. The rongeur is used to remove all of the edges. Careful palpation demonstrates no residual bone present. Three Gelfoam small sponges are then pressed into the shape of a trapezium and sewn together with 4-0 Vicryl. This is then placed into the wound in place of the trapezium and secured to the base of the wound through the flexor tendon. The wound is thoroughly irrigated multiple times throughout the case. Median nerve block and radial cutaneous sensory nerves were previously blocked using plain Marcaine. The dorsal capsule is then meticulously repaired  using 4-0 Mersilene. One 2-0 Vicryl subcutaneous suture is placed and then the skin is closed with a running subcuticular 3-0 Prolene. A soft bulky dressing with a short arm thumb splint is applied. The tourniquet is released. The patient is returned to the recovery room in satisfactory condition having tolerated the procedure quite well.   ____________________________ Lucas Mallow, MD ces:at D: 10/08/2013 10:45:30 ET T: 10/08/2013 10:59:40 ET JOB#: [id number]  cc: Lucas Mallow, MD, <Dictator> Lucas Mallow MD ELECTRONICALLY SIGNED 10/26/2013 14:57

## 2014-04-27 NOTE — Consult Note (Signed)
Minimally displaced lateral malleolus fracture which does not need surgery. Will place in equalizer brace today and patient may be weight bearing as tolerated as her pain and medical situation allows.  Electronic Signatures: Lucas Mallow (MD)  (Signed on 01-Apr-13 10:51)  Authored  Last Updated: 01-Apr-13 10:51 by Lucas Mallow (MD)

## 2014-04-27 NOTE — Consult Note (Signed)
Brief Consult Note: Diagnosis: Right Lateral Malleolus Fracture.   Comments: Patient sustained right lateral malleolus fracture after syncopal episode.  She is admitted for workup of syncope.  I have seen the xrays and discussed case with ED physician as well as Dr. Margaretmary Eddy.   She is comfortable in her splint and will be followed by Dr. Tamala Julian while in house.  No surgical indication at this time.  She is to remain non-weight bearing on the right lower extremity.  Electronic Signatures: Ria Comment (MD)  (Signed 31-Mar-13 18:36)  Authored: Brief Consult Note   Last Updated: 31-Mar-13 18:36 by Ria Comment (MD)

## 2014-04-27 NOTE — Consult Note (Signed)
General Aspect patient is a 69 year old female with history of coronary disease status post PCI and stent placement of the right coronary artery in September of 2008, history of hypertension, hyperlipidemia and valvular disease who presents for evaluation of several syncopal episode.  She had 2 similar episodes in her house palpated by right fibular fracture.  She has never had this before.  She had no chest pain or shortness of breath.  Is of note that she had a four-day history of little or no p.o. intake due to nausea and vomiting.  The time of her syncope, she was nauseated and felt very lightheaded.  Since presentation to the hospital she has had no arrhythmias.  She is rule out for myocardial infarction.  She is been carefully rehydrated is tolerating this well and feels much better.   Physical Exam:   GEN NAD    HEENT PERRL    NECK supple  No masses    RESP normal resp effort  clear BS    CARD Regular rate and rhythm  Murmur    Murmur Systolic    Systolic Murmur axilla    ABD denies tenderness  normal BS  no Abdominal Bruits    LYMPH negative neck    EXTR negative cyanosis/clubbing    SKIN normal to palpation    NEURO cranial nerves intact, motor/sensory function intact    PSYCH A+O to time, place, person   Review of Systems:   Subjective/Chief Complaint syncope    General: No Complaints    Skin: No Complaints    ENT: No Complaints    Eyes: No Complaints    Neck: No Complaints    Respiratory: No Complaints    Cardiovascular: No Complaints    Gastrointestinal: No Complaints    Genitourinary: No Complaints    Vascular: No Complaints    Musculoskeletal: Muscle or joint pain  right lower extremity discomfort secondary to fracture    Neurologic: Dizzness  Fainting    Hematologic: No Complaints    Endocrine: No Complaints    Psychiatric: No Complaints    Review of Systems: All other systems were reviewed and found to be negative     Medications/Allergies Reviewed Medications/Allergies reviewed     HTN:    GERD - Esophageal Reflux:    Osteoarthritis:    restless legs:    Colon Polyps:    chronic affective disorder with fatigue:    hysterectomy:   Home Medications: Medication Instructions Status  omeprazole 40 mg oral delayed release capsule 1 cap(s) orally once a day Active  etodolac 400 mg oral tablet 1 tab(s) orally 2 times a day Active  citalopram 20 mg oral tablet 1 tab(s) orally once a day Active  multivitamin  orally  Active  ranitidine 300 mg oral tablet 1 tab(s) orally once a day (at bedtime) Active  lovastatin 40 mg oral tablet 1 tab(s) orally once a day Active  hydrochlorothiazide-losartan 12.5 mg-50 mg oral tablet 0.5 tab(s) orally once a day (at bedtime) Active  black cohosh 1 tab(s) orally once a day (at bedtime) Active  aspirin 81 mg oral tablet 1 tab(s) orally once a day Active  etodolac 400 mg oral tablet 1 tab(s) orally 2 times a day Active   EKG:   EKG NSR    PCN: Rash    Impression patient is a 69 year old female with history of coronary artery disease status post PCI of the RCA who presented after several syncopal episode which occurred in succession at  a time when she was taking poor p.o. intake due to nausea and vomiting.  She is also on a diuretic as part of her medical regimen along with losartan.  It appears that her syncope was secondary to these drugs in the face of volume depletion.  She has had no evidence of arrhythmias since her hospitalization.  She is ruled out for myocardial infarction.  Echocardiogram read by Dr. Nehemiah Massed did not suggest significant progression of her valvular disease or a cardiomyopathy.  Carotid Dopplers were unremarkable.    Plan 1.  Continue with current medical regimen 2.  Continue with careful rehydration 3.  No further invasive or noninvasive cardiac workup required during this hospitalization.  Would consider placement of a 48-hour Holter  monitor at discharge to follow for arrhythmias.  Consideration for further outpatient workup per Dr. Nehemiah Massed can be discussed   Electronic Signatures: Teodoro Spray (MD)  (Signed 01-Apr-13 16:13)  Authored: General Aspect/Present Illness, History and Physical Exam, Review of System, Past Medical History, Home Medications, EKG , Allergies, Impression/Plan   Last Updated: 01-Apr-13 16:13 by Teodoro Spray (MD)

## 2014-04-27 NOTE — Discharge Summary (Signed)
PATIENT NAME:  Madeline Cabrera, Madeline Cabrera MR#:  [id number] DATE OF BIRTH:  25-Jan-1945  DATE OF ADMISSION:  04/03/2011 DATE OF DISCHARGE:  04/05/2011  DISCHARGE DIAGNOSES:  1. Syncope, most likely secondary to dehydration.  2. Acute renal failure, prerenal, secondary to dehydration, resolved.  3. Coronary artery disease.  4. Hypertension.  5. Hyperlipidemia.  6. Right lateral malleolar fracture.   CONSULTS:  1. Orthopedics, Dr. Christophe Louis. 2. Cardiology, Dr. Nehemiah Massed and Dr. Ubaldo Glassing.   HOSPITAL COURSE:  69 year old female with history of coronary artery disease, hypertension, hyperlipidemia, and depression. She presented with dizziness and syncope. She had two episodes of syncope on Thursday and Friday prior to coming to the hospital. She was also having poor p.o. intake for the last few days because of nausea and she has had a stomach virus. She was admitted as syncope, possibly related to dehydration, and she was also in acute renal failure when she presented. Her creatinine when she came in was 1.57 with a BUN of 28. She was taking losartan, hydrochlorothiazide at home that was stopped,  and with fluids her creatinine improved to 1.14 with a  BUN of 18. She is still mildly orthostatic. I am going to stop losartan, hydrochlorothiazide at discharge. I advised her to wear compression stockings while she is trying to get up and walk around, and remove at bedtime and when lying down. I also advised her to get up from sitiing position to a standing position   very slowly. Other work-up for syncope included a CT of the head that was negative. A CT of the cervical spine was negative for any acute abnormality. She had an ultrasound of the carotids done that did not show any evidence of hemodynamically significant stenosis. Her EKG showed some sinus rhythm, but no acute ischemic changes.  Her echocardiogram showed that the patient had normal LV function, ejection fraction of 55%, left atrium mildly dilated, mild  TR and mild MR. Her urinalysis was negative for any pyuria. Her urine culture was mixed growth. Her hemoglobin when she came in was in the range of 12.9 and remained stable. Hemoglobin was 11.3 on 04/01. She feels great. She is not complaining of any dizziness. No shortness of breath or chest pain at discharged. I discussed with Dr. Nehemiah Massed at discharge.  He is going to follow her orthostatics and blood pressure on Thursday. She has been started on an equalizer brace on the right ankle. She sustained a right lateral malleolar fracture. She has an oblique comminuted fracture in the distal portion of the right fibula with slight distraction. Dr. Tamala Julian saw her and said the patient has a minimally displaced lateral malleolar fracture that does not need surgery, and he advised an equalizer brace and weight-bearing as tolerated. Home Health physical therapy has been arranged for the patient.  I am also giving her Ultram p.r.n. for pain.   MEDICATIONS AT DISCHARGE /HOME MEDICATIONS:  1. Omeprazole 40 mg daily.  2. Etodolac 400 mg b.i.d. 3. Citalopram 20 mg daily.  4. Multivitamin. 5. Ranitidine 300 mg at bedtime. 6. Lovastatin 40 mg daily.  7. Black cohosh. 8. Aspirin 81 mg daily.  9. Ultram 50 mg p.o. q. 6 hours as needed for severe pain. 10. Wear TEDS while getting out of bed and walking. Remove at bedtime and when lying down.  11. NOTE: Do not take a losartan, hydrochlorothiazide. 12. Right ankle in equalizer brace.   DIET: Low sodium, low cholesterol diet.   ACTIVITY: PT at home. Weight-bearing  as tolerated.   CONDITION AT DISCHARGE: Comfortable. T-max 98.3, heart rate 66, blood pressure ranging from 112/73 to 119/69. Blood pressure lying down is 125/75, sitting up 119/71, and standing up 104/66.  She already received fluids for 48 hours.   FOLLOWUP:  1. Patient should follow up with Dr. Nehemiah Massed on Thursday 04/07/2011, at 10:15 a.m.   2. Follow up with Dr. Arline Asp in one or two weeks.  Follow up  BMP at Dr. Milagros Evener office.  3. Please place Holter monitor at discharge. 4. Follow up with Dr. Tamala Julian of orthopedics in two weeks for followup of right lateral malleolar fracture.  5. Home Health with physical therapy.   She remained stable on telemetry here.  A Holter monitor has been placed at the time of discharge to be read by cardiology.    TIME SPENT ON DISCHARGE:  60 minutes.   ____________________________ Mena Pauls, MD ag:bjt D: 04/05/2011 15:03:22 ET T: 04/06/2011 11:00:32 ET JOB#: [id number]  cc: Mena Pauls, MD, <Dictator> Corey Skains, MD Vianne Bulls. Arline Asp, MD Lucas Mallow, MD Mena Pauls MD ELECTRONICALLY SIGNED 04/14/2011 17:35

## 2014-04-27 NOTE — H&P (Signed)
PATIENT NAME:  Madeline Cabrera, Madeline Cabrera MR#:  [id number] DATE OF BIRTH:  November 21, 1945  DATE OF ADMISSION:  04/03/2011  PRIMARY CARE PHYSICIAN: Dr. Arline Asp ER PHYSICIAN: Dr. Liana Gerold  CHIEF COMPLAINT: Dizziness and syncope.   HISTORY OF PRESENT ILLNESS: Patient is a 69 year old female with hypertension, hyperlipidemia and history of arthritis, gastroesophageal reflux disease came in because she had passed out at home. Patient used the bathroom this morning and sat at the kitchen table and suddenly felt very dizzy and she passed out and lost consciousness, probably briefly about 2 or 3 minutes but she was on the floor approximately 30 minutes. Patient also had an episode of syncope Thursday and also Friday. Patient suffered right fibular fracture today. Patient says that she did not have any chest pain, did not have any headache and did not have any palpitations, just felt dizzy last few days and then passed out but she says that she is not eating well for past 2 or 3 days because of nausea and probably stomach virus. Patient says that her appetite is better and she ate well last night. I was asked to admit the patient because of syncope and dizziness. Patient denies any weakness in the hands or legs but just feels very fatigued and tired and that is why she felt very dizzy today.   PAST MEDICAL HISTORY: 1. Gastroesophageal reflux disease. 2. Hypertension. 3. Hyperlipidemia. 4. Depression. 5. Coronary artery disease.   SOCIAL HISTORY: No smoking, no drinking, no drugs.   PAST SURGICAL HISTORY: Sis for stent done four years ago by Dr. Nehemiah Massed.   ALLERGIES: Allergic to penicillin.   FAMILY HISTORY: Significant for mother had hypertension.   MEDICATIONS:  1. Omeprazole 40 mg daily.  2. Etodolac 400 mg p.o. b.i.d.   3. Citalopram 20 mg daily.  4. Lovastatin 40 mg at night. 5. Losartan/HCTZ 50/12.5 mg p.o. daily.  6. Black cohosh. 7. Aspirin 81 mg daily. 8. MiraLax as needed.   REVIEW OF SYSTEMS:  CONSTITUTIONAL: Feels very weak and tired for last three days due to decreased p.o. intake and nausea and also had diarrhea today two times. EYES: No blurred vision. ENT: No tinnitus. No ear pain. No epistaxis. No difficulty swallowing. RESPIRATORY: Patient has been having cough due to allergy season. No wheezing. CARDIOVASCULAR: No chest pain. No palpitations. GASTROINTESTINAL: Had nausea four days ago and diarrhea yesterday and today. No abdominal pain. Having poor appetite for the past few days and not able to eat or drink anything for the past four days. Patient says that her appetite is coming back and feels better than before. GENITOURINARY: No dysuria. ENDOCRINE: No polyuria, nocturia. HEME: No anemia. INTEGUMENTARY: No skin rashes. MUSCULOSKELETAL: Has joint pains. NEUROLOGIC: No dysarthria or ataxia. No headache. Felt very weak and tremulous this morning. No numbness. PSYCH: Has some anxiety.   PHYSICAL EXAMINATION:  VITAL SIGNS: Temperature 96.3, pulse 65, respirations 18, blood pressure 117/58, sats 98% on room air.   GENERAL: Alert, awake, oriented.   HEENT: Head atraumatic, normocephalic. Pupils are equally reacting to light. Extraocular movements are intact. ENT: No tympanic membrane congestion. No turbinate hypertrophy. No oropharyngeal erythema.   NECK: Normal range of motion. No JVD. No carotid bruit.    RESPIRATORY: Clear to auscultation. No wheeze. No rales.  CARDIOVASCULAR: S1, S2 regular. No murmurs. No bradycardia. Chest is nontender. Good pedal pulses present.   ABDOMEN: Soft, nontender, nondistended. Bowel sounds present.   MUSCULOSKELETAL: Strength 5/5 upper extremities. The lower extremity has a cast on  the right leg.   SKIN: No skin rashes.   NEUROLOGICAL: Patient has no sensory or motor deficits. Alert, awake, oriented. No dysarthria.   LYMPH NODES: No lymphadenopathy in cervical or axillary region.  LABORATORY, DIAGNOSTIC AND RADIOLOGICAL DATA: Urine: Yellow hazy  urine, leukocyte esterase negative and bacteria 1+. CT head did not show any acute abnormality. No hemorrhage or mass effect. CT of the cervical spine shows no acute cervical spine abnormality. Right ankle x-ray shows distal fibular fracture on the right side.   WBC 6.9, hemoglobin 12.9, hematocrit 37.7, platelets 203. Electrolytes: Sodium 142, potassium 3.6, chloride 107, bicarbonate 22, BUN 28, creatinine 1.57, glucose 101. Liver functions within normal limits. Troponin less than  0.02. Patient's EKG shows normal sinus rhythm with no ST-T changes, 65 beats per minute.   ASSESSMENT AND PLAN: This is a 69 year old female with:  1. Syncope multiple times with loss of consciousness. Etiology of syncope likely related to decreased p.o. intake with dehydration. Patient had weakness and decreased p.o. intake for the past few days so we are going to admit and give IV fluids. Initial CT is negative but because of the age and risk factors of hypertension and hyperlipidemia we will rule out any abnormalities by doing carotid ultrasound and echocardiogram. Patient had a recent MRI of the brain on 03/25 which did not show any acute changes, showed only chronic ischemic change. Patient's blood pressure is within normal limits and had no bradycardia to explain syncope so we are going to continue IV fluids.  2. Acute on chronic renal failure. Patient had creatinine done on 03/25 which showed 1.61, GFR of 34. Patient has chronic kidney disease but she is not aware of that. Patient's losartan/HCTZ will be held and will continue only Norvasc and check BMP in the morning.  3. Hypertension. Just continue Norvasc. 4. Gastroesophageal reflux disease. Continue PPIs.  5. Right fibula fracture due to syncope and fall. Consult orthopedic and pain management with acetaminophen or Percocet.  6. Condition is stable.   TIME SPENT: About 60 minutes on this patient admission.   ____________________________ Epifanio Lesches,  MD sk:cms D: 04/03/2011 16:29:12 ET T: 04/04/2011 05:27:42 ET JOB#: [id number]  cc: Epifanio Lesches, MD, <Dictator> Vianne Bulls. Arline Asp, MD Epifanio Lesches MD ELECTRONICALLY SIGNED 04/04/2011 8:45

## 2014-04-27 NOTE — Discharge Summary (Signed)
PATIENT NAME:  LUTISHA, KNOCHE MR#:  [id number] DATE OF BIRTH:  [birth year]  DATE OF ADMISSION:  04/03/2011 DATE OF DISCHARGE:  04/05/2011  ADDENDUM  Patient also takes Lodine which is a nonsteroidal anti-inflammatory medication, she takes as needed basis. Her kidney function has normalized now but if her kidney function worsens again the NSAIDs like Lodine can be stopped.   ____________________________ Mena Pauls, MD ag:cms D: 04/05/2011 15:20:02 ET T: 04/06/2011 10:58:01 ET JOB#: [id number]  cc: Mena Pauls, MD, <Dictator> Vianne Bulls. Arline Asp, MD Mena Pauls MD ELECTRONICALLY SIGNED 04/14/2011 17:33

## 2014-11-03 ENCOUNTER — Other Ambulatory Visit: Payer: Medicare Other

## 2014-11-07 ENCOUNTER — Encounter: Admission: RE | Payer: Self-pay | Source: Ambulatory Visit

## 2014-11-07 ENCOUNTER — Ambulatory Visit: Admission: RE | Admit: 2014-11-07 | Payer: PPO | Source: Ambulatory Visit | Admitting: Podiatry

## 2014-11-07 SURGERY — ARTHROPLASTY, TOE
Anesthesia: Choice | Laterality: Left

## 2014-11-30 ENCOUNTER — Encounter: Payer: Self-pay | Admitting: Emergency Medicine

## 2014-11-30 ENCOUNTER — Emergency Department
Admission: EM | Admit: 2014-11-30 | Discharge: 2014-11-30 | Disposition: A | Discharge status: Home / Self Care | Payer: PPO | Attending: Emergency Medicine | Admitting: Emergency Medicine

## 2014-11-30 DIAGNOSIS — M19072 Primary osteoarthritis, left ankle and foot: Secondary | ICD-10-CM | POA: Insufficient documentation

## 2014-11-30 DIAGNOSIS — Z79899 Other long term (current) drug therapy: Secondary | ICD-10-CM | POA: Insufficient documentation

## 2014-11-30 DIAGNOSIS — M160 Bilateral primary osteoarthritis of hip: Secondary | ICD-10-CM

## 2014-11-30 DIAGNOSIS — Z79890 Hormone replacement therapy (postmenopausal): Secondary | ICD-10-CM | POA: Diagnosis not present

## 2014-11-30 DIAGNOSIS — Z880 Allergy status to penicillin: Secondary | ICD-10-CM | POA: Diagnosis not present

## 2014-11-30 DIAGNOSIS — Z87891 Personal history of nicotine dependence: Secondary | ICD-10-CM | POA: Insufficient documentation

## 2014-11-30 DIAGNOSIS — M79605 Pain in left leg: Secondary | ICD-10-CM | POA: Diagnosis present

## 2014-11-30 MED ORDER — OXYCODONE-ACETAMINOPHEN 5-325 MG PO TABS
1.0000 | ORAL_TABLET | Freq: Once | ORAL | Status: AC
  Administered 2014-11-30: 1 via ORAL
  Filled 2014-11-30: qty 1

## 2014-11-30 MED ORDER — OXYCODONE-ACETAMINOPHEN 5-325 MG PO TABS: 1.0000 | ORAL_TABLET | Freq: Four times a day (QID) | ORAL | Status: DC | PRN

## 2014-11-30 NOTE — ED Notes (Signed)
Patient presents to the ED with bilateral hip, leg and foot pain x 3 weeks.  Patient states she has arthritis and regularly gets shots for the pain in her legs but her doctor was unable to make an appointment for the next few days and patient states pain is severe.  Patient is ambulatory to registration desk.  Appears uncomfortable.

## 2014-11-30 NOTE — ED Notes (Signed)
Patient c/o Bilat. Leg pain for 3 weeks, was unable to get an appointment with her MD. Now she is also having pain in both feet and lower back.

## 2014-11-30 NOTE — ED Provider Notes (Signed)
[facility] Emergency Department Provider Note  ____________________________________________  Time seen: Approximately 9:36 AM  I have reviewed the triage vital signs and the nursing notes.   HISTORY  Chief Complaint Leg Pain    HPI Madeline Cabrera is a 69 y.o. female resents emergency department for severe pain from her osteoarthritis. Patient has a known history of osteoarthritis to bilateral hips and left ankle. Patient states that she has been seen by Dr. Jefm Bryant for this condition. She states that symptoms started to increase over the past couple weeks, she made an appointment with orthopedics but states that the first available time is this coming Thursday. She states that the pain at this time as a 10 out of 10 and unbearable. She reports that she routinely receives cortisone injections into bilateral SI joints.She denies any radicular symptoms. She denies any injury precipitating this increase in pain.   Past Medical History  Diagnosis Date  . PONV (postoperative nausea and vomiting)   . Coronary artery disease   . Anxiety   . GERD (gastroesophageal reflux disease)   . Headache(784.0)   . Arthritis   . Hyperlipidemia     There are no active problems to display for this patient.   Past Surgical History  Procedure Laterality Date  . Heel spurs Right   . Abdominal hysterectomy    . Trigger finger release Bilateral   . Cataract extraction Bilateral   . Cardiac catheterization    . Coronary angioplasty with stent placement      Current Outpatient Rx  Name  Route  Sig  Dispense  Refill  . cholecalciferol (VITAMIN D) 1000 UNITS tablet   Oral   Take 1,000 Units by mouth daily.         . citalopram (CELEXA) 20 MG tablet   Oral   Take 20 mg by mouth daily.         . DiphenhydrAMINE HCl (ALLERGY MEDICATION PO)   Oral   Take 1 tablet by mouth every evening.          . estrogens, conjugated, (PREMARIN) 1.25 MG tablet   Oral   Take  1.25 mg by mouth every 7 (seven) days. Patient uses this medication on Sunday nights.         Marland Kitchen HYDROcodone-acetaminophen (NORCO/VICODIN) 5-325 MG per tablet   Oral   Take 0.5 tablets by mouth 3 (three) times daily as needed for pain.          Marland Kitchen HYDROcodone-acetaminophen (NORCO/VICODIN) 5-325 MG per tablet   Oral   Take 1-2 tablets by mouth every 4 (four) hours as needed.   50 tablet   1   . Hypromellose (GENTEAL OP)   Ophthalmic   Apply 2 drops to eye 2 (two) times daily.         Marland Kitchen lovastatin (MEVACOR) 40 MG tablet   Oral   Take 40 mg by mouth at bedtime.         Marland Kitchen omeprazole (PRILOSEC) 40 MG capsule   Oral   Take 40 mg by mouth 2 (two) times daily.         Marland Kitchen oxyCODONE-acetaminophen (ROXICET) 5-325 MG tablet   Oral   Take 1 tablet by mouth every 6 (six) hours as needed for severe pain.   20 tablet   0   . White Petrolatum-Mineral Oil (GENTEAL PM) 85-15 % OINT   Ophthalmic   Apply 1 application to eye every evening.  Allergies Penicillins  No family history on file.  Social History Social History  Substance Use Topics  . Smoking status: Former Smoker -- 0.25 packs/day for 2 years    Quit date: 01/03/2002  . Smokeless tobacco: None  . Alcohol Use: No    Review of Systems Constitutional: No fever/chills Eyes: No visual changes. ENT: No sore throat. Cardiovascular: Denies chest pain. Respiratory: Denies shortness of breath. Gastrointestinal: No abdominal pain.  No nausea, no vomiting.  No diarrhea.  No constipation. Genitourinary: Negative for dysuria. Musculoskeletal: Negative for back pain. Skin: Negative for rash. Neurological: Negative for headaches, focal weakness or numbness.  10-point ROS otherwise negative.  ____________________________________________   PHYSICAL EXAM:  VITAL SIGNS: ED Triage Vitals  Enc Vitals Group     BP 11/30/14 0928 126/80 mmHg     Pulse Rate 11/30/14 0928 81     Resp 11/30/14 0928 18     Temp  11/30/14 0928 98.3 F (36.8 C)     Temp src --      SpO2 11/30/14 0928 99 %     Weight 11/30/14 0928 165 lb (74.844 kg)     Height 11/30/14 0928 5' (1.524 m)     Head Cir --      Peak Flow --      Pain Score 11/30/14 0928 8     Pain Loc --      Pain Edu? --      Excl. in [place]? --     Constitutional: Alert and oriented. Well appearing and in no acute distress. Eyes: Conjunctivae are normal. PERRL. EOMI. Head: Atraumatic. Nose: No congestion/rhinnorhea. Mouth/Throat: Mucous membranes are moist.  Oropharynx non-erythematous. Neck: No stridor.   Cardiovascular: Normal rate, regular rhythm. Grossly normal heart sounds.  Good peripheral circulation. Respiratory: Normal respiratory effort.  No retractions. Lungs CTAB. Gastrointestinal: Soft and nontender. No distention. No abdominal bruits. No CVA tenderness. Musculoskeletal: No lower extremity tenderness nor edema.  No joint effusions. No visible deformity to inspection. No abrasion, contusion, hematoma, ecchymosis noted. Patient is tender to palpation over bilateral SI joints. Patient does have full range of motion to bilateral hips. Pulses and sensation are intact distally. Neurologic:  Normal speech and language. No gross focal neurologic deficits are appreciated. No gait instability. Skin:  Skin is warm, dry and intact. No rash noted. Psychiatric: Mood and affect are normal. Speech and behavior are normal.  ____________________________________________   LABS (all labs ordered are listed, but only abnormal results are displayed)  Labs Reviewed - No data to display ____________________________________________  EKG   ____________________________________________  RADIOLOGY   ____________________________________________   PROCEDURES  Procedure(s) performed: None  Critical Care performed: No  ____________________________________________   INITIAL IMPRESSION / ASSESSMENT AND PLAN / ED COURSE  Pertinent labs & imaging  results that were available during my care of the patient were reviewed by me and considered in my medical decision making (see chart for details).  Agents history, symptoms, physical exam are consistent with a known osteoarthritis. I advised patient that we would not provide cortisone injections into the SI joint here in the emergency department. I advised patient to place her on narcotics for pain control and that she is to follow-up with orthopedics on Thursday for her injections. Patient verbalizes understanding of her diagnosis and treatment plan and verbalizes compliance with same. Patient is stage III kidney disease and I will not place patient on anti-inflammatories at this time. ____________________________________________   FINAL CLINICAL IMPRESSION(S) / ED DIAGNOSES  Final diagnoses:  Osteoarthritis of both hips, unspecified osteoarthritis type  Osteoarthritis of left ankle, unspecified osteoarthritis type      Darletta Moll, PA-C 11/30/14 [place], MD 11/30/14 [phone]

## 2014-11-30 NOTE — Discharge Instructions (Signed)

## 2014-12-24 ENCOUNTER — Encounter
Admission: RE | Admit: 2014-12-24 | Discharge: 2014-12-24 | Disposition: A | Discharge status: Home / Self Care | Payer: PPO | Source: Ambulatory Visit | Attending: Podiatry | Admitting: Podiatry

## 2014-12-24 ENCOUNTER — Ambulatory Visit
Admission: RE | Admit: 2014-12-24 | Discharge: 2014-12-24 | Disposition: A | Discharge status: Home / Self Care | Payer: PPO | Source: Ambulatory Visit | Attending: Podiatry | Admitting: Podiatry

## 2014-12-24 DIAGNOSIS — I10 Essential (primary) hypertension: Secondary | ICD-10-CM | POA: Insufficient documentation

## 2014-12-24 DIAGNOSIS — I2510 Atherosclerotic heart disease of native coronary artery without angina pectoris: Secondary | ICD-10-CM

## 2014-12-24 DIAGNOSIS — Z01818 Encounter for other preprocedural examination: Secondary | ICD-10-CM | POA: Diagnosis not present

## 2014-12-24 LAB — POTASSIUM: Potassium: 3.7 mmol/L (ref 3.5–5.1)

## 2014-12-24 LAB — HEMOGLOBIN: HEMOGLOBIN: 13.5 g/dL (ref 12.0–16.0)

## 2014-12-24 IMAGING — CR DG CHEST 2V
2 series · 2 of 2 positions shown · non-contrast
Comparison: [DATE]

CLINICAL DATA: Preop for foot surgery.

EXAM:
CHEST  2 VIEW

[chest pa]
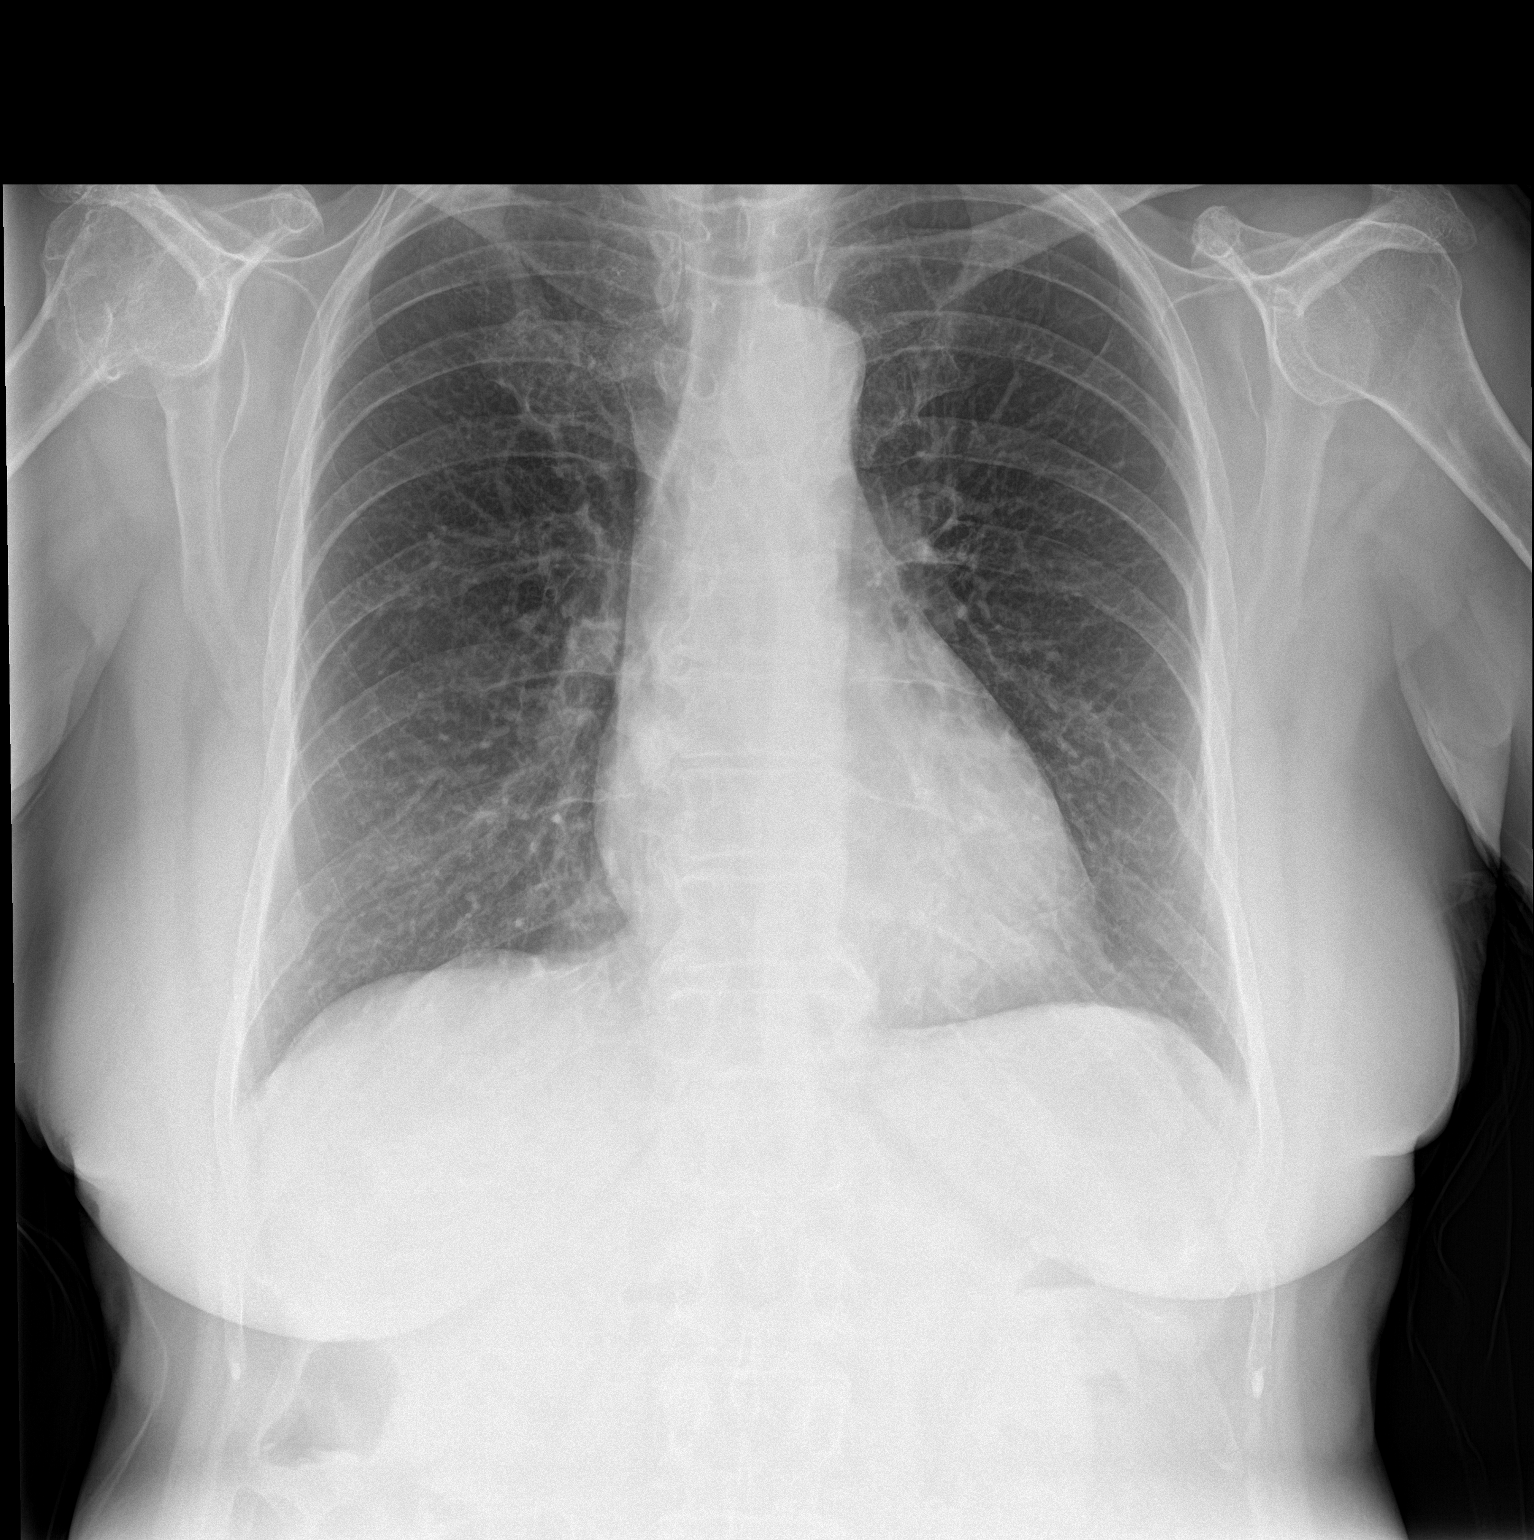

[chest lat]
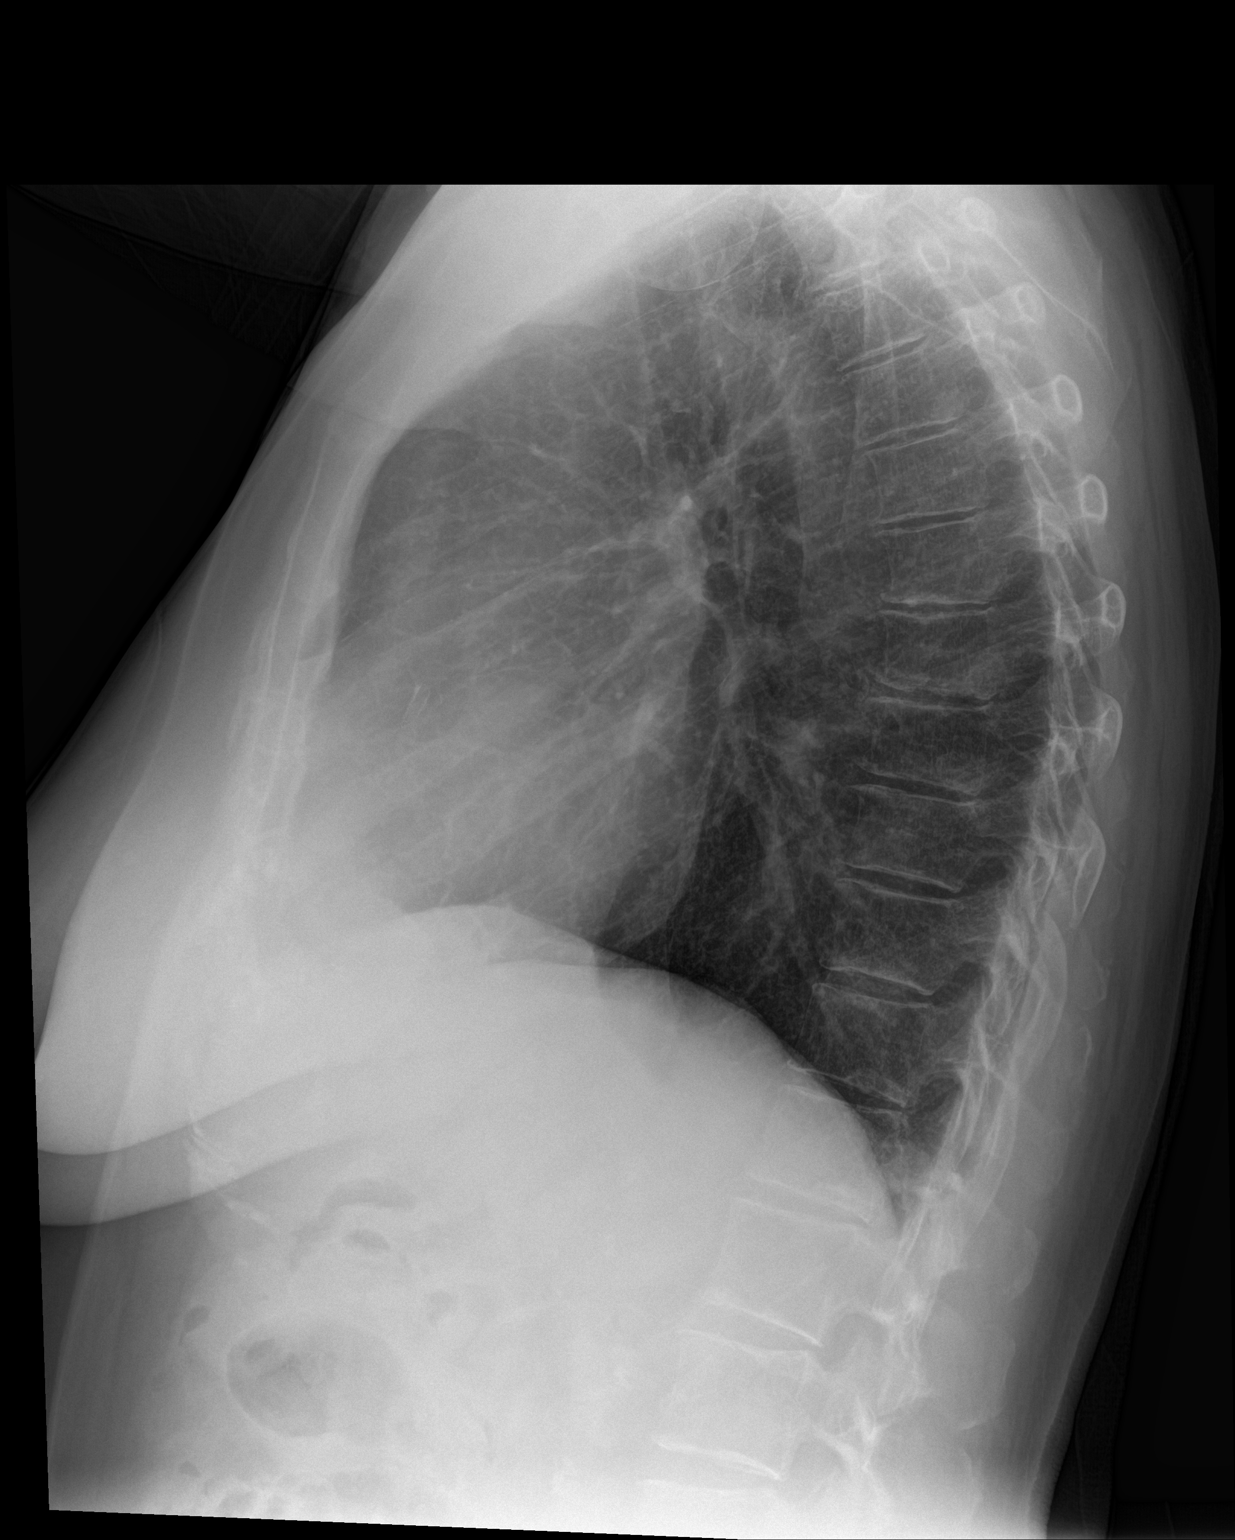

[2 of 2 positions shown; findings below may reference images not displayed]

FINDINGS: The cardiac silhouette, mediastinal and hilar contours are within
normal limits and stable. The lungs are clear of acute process. No
worrisome pulmonary lesions. No pleural effusion. Remote healed
bilateral rib fractures are noted. The thoracic vertebral bodies are
normally aligned. No compression fracture.
IMPRESSION: No acute cardiopulmonary findings. No change since prior examination

## 2014-12-24 NOTE — Patient Instructions (Signed)
  Your procedure is scheduled on: Friday 01/09/2015 Report to Day Surgery. 2ND FLOOR MEDIAL MALL ENTRANCE To find out your arrival time please call [phone] between 1PM - 3PM on Thursday 01/08/2015.  Remember: Instructions that are not followed completely may result in serious medical risk, up to and including death, or upon the discretion of your surgeon and anesthesiologist your surgery may need to be rescheduled.    __X__ 1. Do not eat food or drink liquids after midnight. No gum chewing or hard candies.      __X__ 2. No Alcohol for 24 hours before or after surgery.   ____ 3. Bring all medications with you on the day of surgery if instructed.    __X__ 4. Notify your doctor if there is any change in your medical condition     (cold, fever, infections).     Do not wear jewelry, make-up, hairpins, clips or nail polish.  Do not wear lotions, powders, or perfumes. You may wear deodorant.  Do not shave 48 hours prior to surgery. Men may shave face and neck.  Do not bring valuables to the hospital.    [facility] is not responsible for any belongings or valuables.               Contacts, dentures or bridgework may not be worn into surgery.  Leave your suitcase in the car. After surgery it may be brought to your room.  For patients admitted to the hospital, discharge time is determined by your                treatment team.   Patients discharged the day of surgery will not be allowed to drive home.   Please read over the following fact sheets that you were given:   Surgical Site Infection Prevention   __X__ Take these medicines the morning of surgery with A SIP OF WATER:    1. OMEPRAZOLE  2.   3.   4.  5.  6.  ____ Fleet Enema (as directed)   __X__ Use CHG Soap as directed  ____ Use inhalers on the day of surgery  ____ Stop metformin 2 days prior to surgery    ____ Take 1/2 of usual insulin dose the night before surgery and none on the morning of surgery.   __X__ Stop  Coumadin/Plavix/aspirin on 01/03/2016  ____ Stop Anti-inflammatories on    ____ Stop supplements until after surgery.    ____ Bring C-Pap to the hospital.

## 2015-01-07 DIAGNOSIS — J209 Acute bronchitis, unspecified: Secondary | ICD-10-CM | POA: Diagnosis not present

## 2015-01-09 ENCOUNTER — Ambulatory Visit: Payer: PPO | Admitting: Anesthesiology

## 2015-01-09 ENCOUNTER — Ambulatory Visit
Admission: RE | Admit: 2015-01-09 | Discharge: 2015-01-09 | Disposition: A | Discharge status: Home / Self Care | Payer: PPO | Source: Ambulatory Visit | Attending: Podiatry | Admitting: Podiatry

## 2015-01-09 ENCOUNTER — Encounter
Admission: RE | Disposition: A | Discharge status: Home / Self Care | Payer: Self-pay | Source: Ambulatory Visit | Attending: Podiatry

## 2015-01-09 DIAGNOSIS — Z9889 Other specified postprocedural states: Secondary | ICD-10-CM

## 2015-01-09 DIAGNOSIS — F419 Anxiety disorder, unspecified: Secondary | ICD-10-CM | POA: Diagnosis not present

## 2015-01-09 DIAGNOSIS — R05 Cough: Secondary | ICD-10-CM | POA: Insufficient documentation

## 2015-01-09 DIAGNOSIS — G8929 Other chronic pain: Secondary | ICD-10-CM | POA: Diagnosis not present

## 2015-01-09 DIAGNOSIS — M2022 Hallux rigidus, left foot: Secondary | ICD-10-CM | POA: Diagnosis not present

## 2015-01-09 DIAGNOSIS — Z87891 Personal history of nicotine dependence: Secondary | ICD-10-CM | POA: Diagnosis not present

## 2015-01-09 DIAGNOSIS — I2510 Atherosclerotic heart disease of native coronary artery without angina pectoris: Secondary | ICD-10-CM | POA: Diagnosis not present

## 2015-01-09 HISTORY — PX: ARTHRODESIS METATARSALPHALANGEAL JOINT (MTPJ): SHX6566

## 2015-01-09 SURGERY — FUSION, JOINT, GREAT TOE
Anesthesia: General | Laterality: Left

## 2015-01-09 MED ORDER — FENTANYL CITRATE (PF) 100 MCG/2ML IJ SOLN: 25.0000 ug | INTRAMUSCULAR | Status: DC | PRN

## 2015-01-09 MED ORDER — EPHEDRINE SULFATE 50 MG/ML IJ SOLN
INTRAMUSCULAR | Status: DC | PRN
  Administered 2015-01-09 (×2): 10 mg via INTRAVENOUS

## 2015-01-09 MED ORDER — SCOPOLAMINE 1 MG/3DAYS TD PT72: 1.0000 | MEDICATED_PATCH | TRANSDERMAL | Status: DC

## 2015-01-09 MED ORDER — MIDAZOLAM HCL 2 MG/2ML IJ SOLN
INTRAMUSCULAR | Status: DC | PRN
  Administered 2015-01-09: 2 mg via INTRAVENOUS

## 2015-01-09 MED ORDER — CLINDAMYCIN PHOSPHATE 600 MG/50ML IV SOLN
600.0000 mg | Freq: Once | INTRAVENOUS | Status: AC
  Administered 2015-01-09: 600 mg via INTRAVENOUS

## 2015-01-09 MED ORDER — OXYCODONE-ACETAMINOPHEN 7.5-325 MG PO TABS: 1.0000 | ORAL_TABLET | ORAL | Status: DC | PRN

## 2015-01-09 MED ORDER — FENTANYL CITRATE (PF) 100 MCG/2ML IJ SOLN
INTRAMUSCULAR | Status: DC | PRN
  Administered 2015-01-09: 100 ug via INTRAVENOUS

## 2015-01-09 MED ORDER — BUPIVACAINE HCL (PF) 0.5 % IJ SOLN
INTRAMUSCULAR | Status: DC | PRN
  Administered 2015-01-09: 6 mL

## 2015-01-09 MED ORDER — LIDOCAINE HCL (CARDIAC) 20 MG/ML IV SOLN
INTRAVENOUS | Status: DC | PRN
  Administered 2015-01-09: 30 mg via INTRAVENOUS

## 2015-01-09 MED ORDER — PROPOFOL 10 MG/ML IV BOLUS
INTRAVENOUS | Status: DC | PRN
  Administered 2015-01-09: 150 mg via INTRAVENOUS

## 2015-01-09 MED ORDER — LACTATED RINGERS IV SOLN
INTRAVENOUS | Status: DC
  Administered 2015-01-09 (×2): via INTRAVENOUS

## 2015-01-09 MED ORDER — SCOPOLAMINE 1 MG/3DAYS TD PT72
1.0000 | MEDICATED_PATCH | TRANSDERMAL | Status: DC
  Administered 2015-01-09: 1.5 mg via TRANSDERMAL

## 2015-01-09 MED ORDER — BUPIVACAINE HCL 0.5 % IJ SOLN
INTRAMUSCULAR | Status: DC | PRN
  Administered 2015-01-09: 7 mL

## 2015-01-09 MED ORDER — ONDANSETRON HCL 4 MG/2ML IJ SOLN
INTRAMUSCULAR | Status: DC | PRN
  Administered 2015-01-09: 4 mg via INTRAVENOUS

## 2015-01-09 MED ORDER — ONDANSETRON HCL 4 MG/2ML IJ SOLN: 4.0000 mg | Freq: Once | INTRAMUSCULAR | Status: DC | PRN

## 2015-01-09 MED ORDER — DEXAMETHASONE SODIUM PHOSPHATE 10 MG/ML IJ SOLN
INTRAMUSCULAR | Status: DC | PRN
  Administered 2015-01-09: 4 mg via INTRAVENOUS

## 2015-01-09 SURGICAL SUPPLY — 59 items
BANDAGE ELASTIC 4 LF NS (GAUZE/BANDAGES/DRESSINGS) ×2 IMPLANT
BANDAGE STRETCH 3X4.1 STRL (GAUZE/BANDAGES/DRESSINGS) ×2 IMPLANT
BIT DRILL 2 CANN COUPLING (BIT) ×2 IMPLANT
BIT DRILL 2 FAST STEP (BIT) ×2 IMPLANT
BLADE MED AGGRESSIVE (BLADE) ×2 IMPLANT
BLADE OSC/SAGITTAL MD 5.5X18 (BLADE) ×2 IMPLANT
BLADE SURG MINI STRL (BLADE) ×2 IMPLANT
BNDG ESMARK 4X12 TAN STRL LF (GAUZE/BANDAGES/DRESSINGS) ×2 IMPLANT
BNDG GAUZE 4.5X4.1 6PLY STRL (MISCELLANEOUS) ×2 IMPLANT
BUR 4X45 EGG (BURR) ×2 IMPLANT
CANISTER SUCT 1200ML W/VALVE (MISCELLANEOUS) ×2 IMPLANT
COVER PIN YLW 0.028-062 (MISCELLANEOUS) IMPLANT
CUFF TOURN SGL QUICK 18 (TOURNIQUET CUFF) ×2 IMPLANT
DRAPE FLUOR MINI C-ARM 54X84 (DRAPES) ×2 IMPLANT
DRAPE SHEET LG 3/4 BI-LAMINATE (DRAPES) ×2 IMPLANT
DURAPREP 26ML APPLICATOR (WOUND CARE) ×2 IMPLANT
GAUZE PETRO XEROFOAM 1X8 (MISCELLANEOUS) ×2 IMPLANT
GAUZE SPONGE 4X4 12PLY STRL (GAUZE/BANDAGES/DRESSINGS) ×2 IMPLANT
GLOVE BIO SURGEON STRL SZ7.5 (GLOVE) ×4 IMPLANT
GLOVE BIO SURGEON STRL SZ8 (GLOVE) ×2 IMPLANT
GLOVE BIO SURGEON STRL SZ8.5 (GLOVE) ×2 IMPLANT
GLOVE BIOGEL PI IND STRL 9 (GLOVE) ×1 IMPLANT
GLOVE BIOGEL PI INDICATOR 9 (GLOVE) ×1
GOWN STRL REUS W/ TWL LRG LVL3 (GOWN DISPOSABLE) ×3 IMPLANT
GOWN STRL REUS W/TWL LRG LVL3 (GOWN DISPOSABLE) ×3
K-WIRE 1.1 (WIRE) ×2
K-WIRE ACE 1.6X6 (WIRE) ×4
K-WIRE FX150X1.1XTROC TIP (WIRE) ×2
KIT RM TURNOVER STRD PROC AR (KITS) ×2 IMPLANT
KWIRE ACE 1.6X6 (WIRE) ×2 IMPLANT
KWIRE FX150X1.1XTROC TIP (WIRE) ×2 IMPLANT
LABEL OR SOLS (LABEL) IMPLANT
NEEDLE FILTER BLUNT 18X 1/2SAF (NEEDLE) ×1
NEEDLE FILTER BLUNT 18X1 1/2 (NEEDLE) ×1 IMPLANT
NEEDLE HYPO 25X1 1.5 SAFETY (NEEDLE) ×2 IMPLANT
NS IRRIG 500ML POUR BTL (IV SOLUTION) ×2 IMPLANT
PACK EXTREMITY ARMC (MISCELLANEOUS) ×2 IMPLANT
PAD GROUND ADULT SPLIT (MISCELLANEOUS) ×2 IMPLANT
PAD PREP 24X41 OB/GYN DISP (PERSONAL CARE ITEMS) ×2 IMPLANT
PENCIL ELECTRO HAND CTR (MISCELLANEOUS) ×2 IMPLANT
PLATE LOCK 1ST LT MTP (Plate) ×2 IMPLANT
RASP SM TEAR CROSS CUT (RASP) ×2 IMPLANT
SCREW CAN 3.0X36 (Screw) ×4 IMPLANT
SCREW CANN 3.0X28 (Screw) ×2 IMPLANT
SCREW PEG 2.5X12 NONLOCK (Screw) ×2 IMPLANT
SCREW PEG 2.5X16 NONLOCK (Screw) ×2 IMPLANT
SCREW PEG LOCK 2.5X10 (Peg) ×2 IMPLANT
SCREW PEG LOCK 2.5X12 (Screw) ×2 IMPLANT
SCREW PEG LOCK 2.5X16 (Peg) ×2 IMPLANT
SPLINT FAST PLASTER 5X30 (CAST SUPPLIES) ×1
SPLINT PLASTER CAST FAST 5X30 (CAST SUPPLIES) ×1 IMPLANT
STOCKINETTE STRL 6IN 960660 (GAUZE/BANDAGES/DRESSINGS) ×2 IMPLANT
STRIP CLOSURE SKIN 1/4X4 (GAUZE/BANDAGES/DRESSINGS) ×2 IMPLANT
SUT ETHILON 5-0 FS-2 18 BLK (SUTURE) ×2 IMPLANT
SUT VIC AB 4-0 FS2 27 (SUTURE) ×2 IMPLANT
SYR 20CC LL (SYRINGE) IMPLANT
SYRINGE 10CC LL (SYRINGE) ×2 IMPLANT
WIRE Z .045 C-WIRE SPADE TIP (WIRE) IMPLANT
WIRE Z .062 C-WIRE SPADE TIP (WIRE) IMPLANT

## 2015-01-09 NOTE — Anesthesia Postprocedure Evaluation (Signed)
Anesthesia Post Note  Patient: Madeline Cabrera  Procedure(s) Performed: Procedure(s) (LRB): ARTHRODESIS METATARSALPHALANGEAL JOINT (MTPJ) (Left)  Patient location during evaluation: PACU Anesthesia Type: General Level of consciousness: awake Pain management: pain level controlled Vital Signs Assessment: post-procedure vital signs reviewed and stable Respiratory status: respiratory function stable Cardiovascular status: stable Anesthetic complications: no    Last Vitals:  Filed Vitals:   01/09/15 0915 01/09/15 0928  BP: 157/83   Pulse: 94 89  Temp: 37 C   Resp: 22 17    Last Pain:  Filed Vitals:   01/09/15 0929  PainSc: 0-No pain                 VAN STAVEREN,[place]

## 2015-01-09 NOTE — H&P (Signed)
H and P has been reviewed and no changes are noted.  

## 2015-01-09 NOTE — Evaluation (Signed)
Physical Therapy Evaluation Patient Details Name: Madeline Cabrera MRN: [id number] DOB: [birth year] Today's Date: 01/09/2015   History of Present Illness  Pt is 70 year old female who was admitted for hallux rigidus and fusion on L 1st metphaljoint. Pt previous independent at baseline.  Clinical Impression  Pt is a pleasant 70 year old female who was admitted for L LE fusion on foot. Pt performs transfers and ambulation with rw with safe technique. Pt demonstrates deficits with strength/endurance/balance. Would benefit from skilled PT to address above deficits and promote optimal return to PLOF. Would benefit from knee scooter as she fatigues quickly with rw for longer distances. Pt reports she can borrow rw from her pastor and get a knee scooter through insurance. RN updated. Does not require further OP PT until WB status lifted.      Follow Up Recommendations No PT follow up    Equipment Recommendations  Rolling walker with 5" wheels (or knee scooter)    Recommendations for Other Services       Precautions / Restrictions Precautions Precautions: None Restrictions Weight Bearing Restrictions: Yes LLE Weight Bearing: Non weight bearing      Mobility  Bed Mobility               General bed mobility comments: recieved up in recliner  Transfers Overall transfer level: Needs assistance Equipment used: Rolling walker (2 wheeled) Transfers: Sit to/from Stand Sit to Stand: Min guard         General transfer comment: sit<>Stand with rw. Safe technique performed with cues for pushing from seated surface.  Ambulation/Gait Ambulation/Gait assistance: Min guard Ambulation Distance (Feet): 20 Feet Assistive device: Rolling walker (2 wheeled) Gait Pattern/deviations: Step-to pattern     General Gait Details: ambulated using rw and safe technique. Pt fatigues with increased ambulation, however is able to maintain correct WB status.   Stairs            Wheelchair  Mobility    Modified Rankin (Stroke Patients Only)       Balance Overall balance assessment: Needs assistance Sitting-balance support: Feet supported Sitting balance-Leahy Scale: Normal     Standing balance support: Bilateral upper extremity supported Standing balance-Leahy Scale: Good                               Pertinent Vitals/Pain Pain Assessment: No/denies pain    Home Living Family/patient expects to be discharged to:: Private residence Living Arrangements: Spouse/significant other Available Help at Discharge: Family Type of Home: House Home Access: Level entry     Home Layout: One level Home Equipment: Environmental consultant - 4 wheels      Prior Function Level of Independence: Independent               Hand Dominance        Extremity/Trunk Assessment   Upper Extremity Assessment: Overall WFL for tasks assessed           Lower Extremity Assessment: Generalized weakness (L LE grossly 3/5)         Communication   Communication: No difficulties  Cognition Arousal/Alertness: Awake/alert Behavior During Therapy: WFL for tasks assessed/performed Overall Cognitive Status: Within Functional Limits for tasks assessed                      General Comments      Exercises        Assessment/Plan    PT Assessment  Patient needs continued PT services  PT Diagnosis Difficulty walking;Generalized weakness   PT Problem List Decreased strength;Decreased balance  PT Treatment Interventions Gait training;Therapeutic exercise   PT Goals (Current goals can be found in the Care Plan section) Acute Rehab PT Goals Patient Stated Goal: to go home PT Goal Formulation: With patient Time For Goal Achievement: 01/23/15 Potential to Achieve Goals: Good    Frequency Min 2X/week   Barriers to discharge   needs RW for home use, or knee scooter    Co-evaluation               End of Session Equipment Utilized During Treatment: Gait  belt Activity Tolerance: Patient tolerated treatment well Patient left: in chair Nurse Communication: Mobility status;Weight bearing status    Functional Assessment Tool Used: clinical judgement Functional Limitation: Mobility: Walking and moving around Mobility: Walking and Moving Around Current Status [id number]): At least 20 percent but less than 40 percent impaired, limited or restricted Mobility: Walking and Moving Around Goal Status [phone]): At least 1 percent but less than 20 percent impaired, limited or restricted    Time: [id number] PT Time Calculation (min) (ACUTE ONLY): 14 min   Charges:   PT Evaluation $PT Eval Moderate Complexity: 1 Procedure     PT G Codes:   PT G-Codes **NOT FOR INPATIENT CLASS** Functional Assessment Tool Used: clinical judgement Functional Limitation: Mobility: Walking and moving around Mobility: Walking and Moving Around Current Status [id number]): At least 20 percent but less than 40 percent impaired, limited or restricted Mobility: Walking and Moving Around Goal Status [phone]): At least 1 percent but less than 20 percent impaired, limited or restricted    Laquinta Hazell 01/09/2015, 11:53 AM Greggory Stallion, PT, DPT [phone]

## 2015-01-09 NOTE — Op Note (Signed)
Operative note   Surgeon: Dr. Albertine Lilja, DPM.    Assistant: None    Preop diagnosis: Hallux rigidus left first metatarsophalangeal joint.    Postop diagnosis: Same    Procedure:   1. Arthrodesis first metatarsal phalangeal joint with Biomet screws and plate          EBL: Less than 5 cc    Anesthesia:general with local block    Hemostasis: Ankle tourniquet at 59 minutes at 250 mL mercury pressure    Specimen: Degenerative bone and cartilage including osteophytes from the left first metatarsophalangeal joint    Complications: None    Operative indications: Chronic pain and degeneration to the left first metatarsal phalangeal joint resistant to conservative care    Procedure:  Patient was brought into the OR and placed on the operating table in thesupine position. After anesthesia was obtained theleft lower extremity was prepped and draped in usual sterile fashion.  Operative Report: This time attention was directed to the dorsum of the left foot where a 5 cemented dorsolinear skin incision was made over the first MTPJ. Incision was deepened with sharp blunt dissection bleeders were clamped and bovied as required. This time incision made through the capsule down to bone and this was freed medially and laterally from the first metatarsal head as well as dorsally. Significant degenerative changes were noted osteophyte formation was noted in the joint this was removed. Pronounced spurring was noted on the dorsal metatarsal head this was resected as well. This point it was noted the articular cartilage was probably 80% eroded from the first metatarsal head. A Biomet reamer was used to remove most of the cartilage and this was cleaned up with a combination of curettage and a power bur. Cartilage was removed from the proximal phalanx with curettage and a power bur. Multiple holes were achieved and the area this time with a 1.5 drill on both sides of the joint. This point the proximal  phalanx and the first metatarsal line with a slight amount of dorsiflexion at the MTPJ. Crossing K wires were placed across the area and then checked FluoroScan good position as noted in good contact between the proximal phalanx and the first metatarsal is noted as well. Crossing screws 30 cannulated screws from the Biomet set were then placed across the area. This checked FluoroScan good position and correction were noted good compression across the first MTPJ was noted as well. This time a plate and screws were placed across first MTPJ also using the Biomet system. 2 screws distal and 2 screws proximal. 3 screws were locking screws one screw was a compression screw.  This time there was copiously irrigated and the capsular and periosteal tissues and closed with 4 Vicryl in continuous stitch. Deep and superficial fascial layers were closed with 4 Vicryl also in a continuous stitch. Skin was closed with 4 Vicryl in a subcuticular stitch. At this time there is block 0.5% Marcaine plain and a sterile compressive dressings placed across wound consisting of Steri-Strips Xeroform gauze 4 x 4's and Kling and Kerlix. A posterior splint consisting of fiberglass and plaster was placed on the left foot leg in the operating room.    Patient tolerated the procedure and anesthesia well.  Was transported from the OR to the PACU with all vital signs stable and vascular status intact. To be discharged per routine protocol.  Will follow up in approximately 1 week in the outpatient clinic.

## 2015-01-09 NOTE — Anesthesia Preprocedure Evaluation (Signed)
Anesthesia Evaluation  Patient identified by MRN, date of birth, ID band Patient awake    Reviewed: Allergy & Precautions, NPO status , Patient's Chart, lab work & pertinent test results  History of Anesthesia Complications (+) PONV  Airway Mallampati: III       Dental  (+) Teeth Intact   Pulmonary Recent URI , Residual Cough, former smoker,    breath sounds clear to auscultation       Cardiovascular Exercise Tolerance: Good + CAD   Rhythm:Regular     Neuro/Psych Anxiety    GI/Hepatic Neg liver ROS, GERD  ,  Endo/Other  negative endocrine ROS  Renal/GU negative Renal ROS     Musculoskeletal negative musculoskeletal ROS (+)   Abdominal Normal abdominal exam  (+)   Peds  Hematology negative hematology ROS (+)   Anesthesia Other Findings   Reproductive/Obstetrics                             Anesthesia Physical Anesthesia Plan  ASA: II  Anesthesia Plan: General   Post-op Pain Management:    Induction: Intravenous  Airway Management Planned: LMA  Additional Equipment:   Intra-op Plan:   Post-operative Plan: Extubation in OR  Informed Consent: I have reviewed the patients History and Physical, chart, labs and discussed the procedure including the risks, benefits and alternatives for the proposed anesthesia with the patient or authorized representative who has indicated his/her understanding and acceptance.     Plan Discussed with: CRNA  Anesthesia Plan Comments:         Anesthesia Quick Evaluation

## 2015-01-09 NOTE — Discharge Instructions (Signed)
[address] [place]   1. Take your medication as prescribed.  Pain medication should be taken only as needed.  2. Keep the dressing clean, dry and intact.  3. Keep your foot elevated above the heart level for the first 48 hours.  4. Walking to the bathroom and brief periods of walking are acceptable, unless we have instructed you to be non-weight bearing.  5. Always wear your post-op shoe when walking.  Always use your crutches if you are to be non-weight bearing.  6. Do not take a shower. Baths are permissible as long as the foot is kept out of the water.   7. Every hour you are awake:  - Bend your knee 15 times. - Flex foot 15 times - Massage calf 15 times  8. Call [facility] [phone]) if any of the following problems occur: - You develop a temperature or fever. - The bandage becomes saturated with blood. - Medication does not stop your pain. - Injury of the foot occurs. - Any symptoms of infection including redness, odor, or red streaks running from wound.   AMBULATORY SURGERY  DISCHARGE INSTRUCTIONS   1) The drugs that you were given will stay in your system until tomorrow so for the next 24 hours you should not:  A) Drive an automobile B) Make any legal decisions C) Drink any alcoholic beverage  2) You may resume regular meals tomorrow.  Today it is better to start with liquids and gradually work up to solid foods.  You may eat anything you prefer, but it is better to start with liquids, then soup and crackers, and gradually work up to solid foods.  3) Please notify your doctor immediately if you have any unusual bleeding, trouble breathing, redness and pain at the surgery site, drainage, fever, or pain not relieved by medication.  4) Additional Instructions: use a stool softener such as Docusate/Colace to prevent  constipation   Please contact your physician with any problems or Same Day Surgery at [phone], Monday through Friday 6 am to 4 pm, or [place] at [facility] number at [phone].

## 2015-01-09 NOTE — Transfer of Care (Signed)
Immediate Anesthesia Transfer of Care Note  Patient: Madeline Cabrera  Procedure(s) Performed: Procedure(s): ARTHRODESIS METATARSALPHALANGEAL JOINT (MTPJ) (Left)  Patient Location: PACU  Anesthesia Type:General  Level of Consciousness: awake, alert  and oriented  Airway & Oxygen Therapy: Patient Spontanous Breathing and Patient connected to face mask oxygen  Post-op Assessment: Report given to RN and Post -op Vital signs reviewed and stable  Post vital signs: Reviewed and stable  Last Vitals:  Filed Vitals:   01/09/15 0611  BP: 158/99  Pulse: 70  Temp: 36.1 C  Resp: 16    Complications: No apparent anesthesia complications

## 2015-01-09 NOTE — OR Nursing (Signed)
Dr Elvina Mattes at bedside, oked to give clindamycin iv x one dose. Explained nausea is a side effect vs an actual allergy

## 2015-01-12 ENCOUNTER — Encounter: Payer: Self-pay | Admitting: Podiatry

## 2015-01-12 LAB — SURGICAL PATHOLOGY

## 2015-01-14 DIAGNOSIS — Z9889 Other specified postprocedural states: Secondary | ICD-10-CM | POA: Diagnosis not present

## 2015-01-26 DIAGNOSIS — M2022 Hallux rigidus, left foot: Secondary | ICD-10-CM | POA: Diagnosis not present

## 2015-02-09 DIAGNOSIS — M2022 Hallux rigidus, left foot: Secondary | ICD-10-CM | POA: Diagnosis not present

## 2015-02-23 DIAGNOSIS — M2022 Hallux rigidus, left foot: Secondary | ICD-10-CM | POA: Diagnosis not present

## 2015-03-09 DIAGNOSIS — M79672 Pain in left foot: Secondary | ICD-10-CM | POA: Diagnosis not present

## 2015-03-30 DIAGNOSIS — M79672 Pain in left foot: Secondary | ICD-10-CM | POA: Diagnosis not present

## 2015-04-16 DIAGNOSIS — Z0000 Encounter for general adult medical examination without abnormal findings: Secondary | ICD-10-CM | POA: Diagnosis not present

## 2015-04-16 DIAGNOSIS — I10 Essential (primary) hypertension: Secondary | ICD-10-CM | POA: Diagnosis not present

## 2015-04-16 DIAGNOSIS — I2510 Atherosclerotic heart disease of native coronary artery without angina pectoris: Secondary | ICD-10-CM | POA: Diagnosis not present

## 2015-04-16 DIAGNOSIS — E782 Mixed hyperlipidemia: Secondary | ICD-10-CM | POA: Diagnosis not present

## 2015-04-29 DIAGNOSIS — I38 Endocarditis, valve unspecified: Secondary | ICD-10-CM | POA: Diagnosis not present

## 2015-04-29 DIAGNOSIS — E782 Mixed hyperlipidemia: Secondary | ICD-10-CM | POA: Diagnosis not present

## 2015-04-29 DIAGNOSIS — I10 Essential (primary) hypertension: Secondary | ICD-10-CM | POA: Diagnosis not present

## 2015-04-29 DIAGNOSIS — I2510 Atherosclerotic heart disease of native coronary artery without angina pectoris: Secondary | ICD-10-CM | POA: Diagnosis not present

## 2015-05-12 DIAGNOSIS — M5416 Radiculopathy, lumbar region: Secondary | ICD-10-CM | POA: Diagnosis not present

## 2015-05-12 DIAGNOSIS — M4806 Spinal stenosis, lumbar region: Secondary | ICD-10-CM | POA: Diagnosis not present

## 2015-05-12 DIAGNOSIS — M5136 Other intervertebral disc degeneration, lumbar region: Secondary | ICD-10-CM | POA: Diagnosis not present

## 2015-05-12 DIAGNOSIS — M7062 Trochanteric bursitis, left hip: Secondary | ICD-10-CM | POA: Diagnosis not present

## 2015-06-04 DIAGNOSIS — M1712 Unilateral primary osteoarthritis, left knee: Secondary | ICD-10-CM | POA: Diagnosis not present

## 2015-06-04 DIAGNOSIS — S8002XA Contusion of left knee, initial encounter: Secondary | ICD-10-CM | POA: Diagnosis not present

## 2015-07-02 DIAGNOSIS — F319 Bipolar disorder, unspecified: Secondary | ICD-10-CM | POA: Diagnosis not present

## 2015-07-02 DIAGNOSIS — M542 Cervicalgia: Secondary | ICD-10-CM | POA: Diagnosis not present

## 2015-07-02 DIAGNOSIS — I10 Essential (primary) hypertension: Secondary | ICD-10-CM | POA: Diagnosis not present

## 2015-07-02 DIAGNOSIS — R51 Headache: Secondary | ICD-10-CM | POA: Diagnosis not present

## 2015-07-02 DIAGNOSIS — E782 Mixed hyperlipidemia: Secondary | ICD-10-CM | POA: Diagnosis not present

## 2015-07-02 DIAGNOSIS — N183 Chronic kidney disease, stage 3 (moderate): Secondary | ICD-10-CM | POA: Diagnosis not present

## 2015-07-02 DIAGNOSIS — G8929 Other chronic pain: Secondary | ICD-10-CM | POA: Diagnosis not present

## 2015-07-02 DIAGNOSIS — I2510 Atherosclerotic heart disease of native coronary artery without angina pectoris: Secondary | ICD-10-CM | POA: Diagnosis not present

## 2015-08-19 DIAGNOSIS — H353132 Nonexudative age-related macular degeneration, bilateral, intermediate dry stage: Secondary | ICD-10-CM | POA: Diagnosis not present

## 2015-08-31 DIAGNOSIS — M5416 Radiculopathy, lumbar region: Secondary | ICD-10-CM | POA: Diagnosis not present

## 2015-08-31 DIAGNOSIS — M5136 Other intervertebral disc degeneration, lumbar region: Secondary | ICD-10-CM | POA: Diagnosis not present

## 2015-08-31 DIAGNOSIS — M7062 Trochanteric bursitis, left hip: Secondary | ICD-10-CM | POA: Diagnosis not present

## 2015-10-02 ENCOUNTER — Emergency Department
Admission: EM | Admit: 2015-10-02 | Discharge: 2015-10-02 | Disposition: A | Discharge status: Home / Self Care | Payer: PPO | Attending: Student in an Organized Health Care Education/Training Program | Admitting: Student in an Organized Health Care Education/Training Program

## 2015-10-02 ENCOUNTER — Encounter: Payer: Self-pay | Admitting: Emergency Medicine

## 2015-10-02 DIAGNOSIS — I2510 Atherosclerotic heart disease of native coronary artery without angina pectoris: Secondary | ICD-10-CM | POA: Insufficient documentation

## 2015-10-02 DIAGNOSIS — M5137 Other intervertebral disc degeneration, lumbosacral region: Secondary | ICD-10-CM | POA: Diagnosis not present

## 2015-10-02 DIAGNOSIS — G8929 Other chronic pain: Secondary | ICD-10-CM | POA: Diagnosis not present

## 2015-10-02 DIAGNOSIS — M549 Dorsalgia, unspecified: Secondary | ICD-10-CM | POA: Diagnosis not present

## 2015-10-02 DIAGNOSIS — M5431 Sciatica, right side: Secondary | ICD-10-CM

## 2015-10-02 DIAGNOSIS — Z7982 Long term (current) use of aspirin: Secondary | ICD-10-CM | POA: Insufficient documentation

## 2015-10-02 DIAGNOSIS — M5441 Lumbago with sciatica, right side: Secondary | ICD-10-CM | POA: Diagnosis not present

## 2015-10-02 DIAGNOSIS — Z87891 Personal history of nicotine dependence: Secondary | ICD-10-CM | POA: Diagnosis not present

## 2015-10-02 DIAGNOSIS — Z79899 Other long term (current) drug therapy: Secondary | ICD-10-CM | POA: Insufficient documentation

## 2015-10-02 MED ORDER — ORPHENADRINE CITRATE 30 MG/ML IJ SOLN
60.0000 mg | INTRAMUSCULAR | Status: AC
  Administered 2015-10-02: 60 mg via INTRAMUSCULAR
  Filled 2015-10-02: qty 2

## 2015-10-02 MED ORDER — KETOROLAC TROMETHAMINE 30 MG/ML IJ SOLN
INTRAMUSCULAR | Status: AC
  Filled 2015-10-02: qty 1

## 2015-10-02 MED ORDER — KETOROLAC TROMETHAMINE 60 MG/2ML IM SOLN
15.0000 mg | Freq: Once | INTRAMUSCULAR | Status: AC
  Administered 2015-10-02: 15 mg via INTRAMUSCULAR

## 2015-10-02 MED ORDER — PREDNISONE 10 MG PO TABS: 10.0000 mg | ORAL_TABLET | Freq: Two times a day (BID) | ORAL | 0 refills | Status: DC

## 2015-10-02 MED ORDER — CYCLOBENZAPRINE HCL 5 MG PO TABS: 5.0000 mg | ORAL_TABLET | Freq: Three times a day (TID) | ORAL | 0 refills | Status: DC | PRN

## 2015-10-02 NOTE — ED Triage Notes (Signed)
C/o lower back pain rad down right leg.  States she has arthritis and gets shots for it but her MD is out of town.

## 2015-10-02 NOTE — Discharge Instructions (Signed)
Your exam is consistent with lower back pain with referral down the right leg due to underlying degenerative disc disease and arthritis of the spine. You should take your daily Hydrocodone along with the prescription muscle relaxant (Flexeril) and the anti-inflammatory (Prednisone). You should follow-up with Dr. Sharlet Salina next week for ongoing symptom management. Return to the ED immediately for leg weakness, foot drop, loss of bladder/bowel control, or numbness/tingling in your crotch.

## 2015-10-02 NOTE — ED Provider Notes (Signed)
[facility] Emergency Department Provider Note ____________________________________________  Time seen: 34  I have reviewed the triage vital signs and the nursing notes.  HISTORY  Chief Complaint  Back Pain  HPI Madeline Cabrera is a 70 y.o. female presents herself to the ED via personal vehicle for evaluation and management of acute back pain with referral on the right lower leg. She describes a history of chronic arthritis and degenerative disc disease. She is about 3 years status post an L5-S1 fusion surgery. She denies any recent injury, accident, trauma. She also denies any bladder or bowel incontinence or leg weakness. She does report the symptoms are flared above her baseline. She has Vicodin to dose at home but reports no other medications for acute pain. She also denies any prescriptions for spasm or inflammation. She generally does not have flares of the right lower extremity. She is scheduled to see Dr. Sharlet Salina in 3-4 days for follow-up.  Past Medical History:  Diagnosis Date  . Anxiety   . Arthritis   . Coronary artery disease   . GERD (gastroesophageal reflux disease)   . Headache(784.0)   . Hyperlipidemia   . PONV (postoperative nausea and vomiting)     There are no active problems to display for this patient.   Past Surgical History:  Procedure Laterality Date  . ABDOMINAL HYSTERECTOMY    . ARTHRODESIS METATARSALPHALANGEAL JOINT (MTPJ) Left 01/09/2015   Procedure: ARTHRODESIS METATARSALPHALANGEAL JOINT (MTPJ);  Surgeon: Albertine Rondia, DPM;  Location: [facility] ORS;  Service: Podiatry;  Laterality: Left;  . CARDIAC CATHETERIZATION    . CATARACT EXTRACTION Bilateral   . CORONARY ANGIOPLASTY WITH STENT PLACEMENT    . heel spurs Right   . TRIGGER FINGER RELEASE Bilateral     Prior to Admission medications   Medication Sig Start Date End Date Taking? Authorizing Provider  aspirin 81 MG tablet Take 81 mg by mouth daily.    Historical Provider,  MD  cholecalciferol (VITAMIN D) 1000 UNITS tablet Take 1,000 Units by mouth daily.    Historical Provider, MD  citalopram (CELEXA) 20 MG tablet Take 40 mg by mouth daily.     Historical Provider, MD  cyclobenzaprine (FLEXERIL) 5 MG tablet Take 1 tablet (5 mg total) by mouth 3 (three) times daily as needed for muscle spasms. 10/02/15   Johnny Latu V Bacon Ariq Khamis, PA-C  DiphenhydrAMINE HCl (ALLERGY MEDICATION PO) Take 1 tablet by mouth every evening.     Historical Provider, MD  docusate sodium (COLACE) 250 MG capsule Take 250 mg by mouth at bedtime.    Historical Provider, MD  estrogens, conjugated, (PREMARIN) 1.25 MG tablet Take 1.25 mg by mouth every 7 (seven) days. Reported on 12/24/2014    Historical Provider, MD  Hypromellose (GENTEAL OP) Apply 2 drops to eye 2 (two) times daily.    Historical Provider, MD  lovastatin (MEVACOR) 40 MG tablet Take 40 mg by mouth at bedtime.    Historical Provider, MD  omeprazole (PRILOSEC) 40 MG capsule Take 40 mg by mouth 2 (two) times daily.    Historical Provider, MD  oxyCODONE-acetaminophen (PERCOCET) 7.5-325 MG tablet Take 1 tablet by mouth every 4 (four) hours as needed for severe pain. 01/09/15   Albertine Marytza, DPM  predniSONE (DELTASONE) 10 MG tablet Take 1 tablet (10 mg total) by mouth 2 (two) times daily with a meal. 10/02/15   Lelah Rennaker V Bacon Shirl Weir, PA-C  scopolamine (TRANSDERM-SCOP) 1 MG/3DAYS Place 1 patch (1.5 mg total) onto the skin every 3 (three)  days. 01/09/15   Albertine Ilee, DPM  traMADol (ULTRAM) 50 MG tablet Take by mouth 2 (two) times daily as needed.    Historical Provider, MD  White Petrolatum-Mineral Oil (GENTEAL PM) 85-15 % OINT Apply 1 application to eye every evening.    Historical Provider, MD    Allergies Clindamycin/lincomycin; Doxycycline; Hydrocodone-guaifenesin; Minocycline; Naprosyn [naproxen]; Biaxin [clarithromycin]; and Penicillins  No family history on file.  Social History Social History  Substance Use Topics  . Smoking  status: Former Smoker    Packs/day: 0.25    Years: 2.00    Quit date: 01/03/2002  . Smokeless tobacco: Never Used  . Alcohol use No    Review of Systems  Constitutional: Negative for fever. Cardiovascular: Negative for chest pain. Respiratory: Negative for shortness of breath. Gastrointestinal: Negative for abdominal pain, vomiting and diarrhea. Genitourinary: Negative for dysuria. Musculoskeletal: Positive for back pain with RLE referral as above. Skin: Negative for rash. Neurological: Negative for headaches, focal weakness or numbness. ____________________________________________  PHYSICAL EXAM:  VITAL SIGNS: ED Triage Vitals  Enc Vitals Group     BP 10/02/15 1402 (!) 162/74     Pulse Rate 10/02/15 1402 77     Resp 10/02/15 1402 20     Temp 10/02/15 1402 98.1 F (36.7 C)     Temp Source 10/02/15 1402 Oral     SpO2 10/02/15 1402 98 %     Weight 10/02/15 1402 161 lb (73 kg)     Height 10/02/15 1402 5\' 2"  (1.575 m)     Head Circumference --      Peak Flow --      Pain Score 10/02/15 1403 9     Pain Loc --      Pain Edu? --      Excl. in [place]? --    Constitutional: Alert and oriented. Well appearing and in no distress. Head: Normocephalic and atraumatic. Cardiovascular: Normal distal pulses.  Respiratory: Normal respiratory effort.  Musculoskeletal: Normal spinal alignment without midline tenderness, spasm, deformity, step-off. Patient is tender to palpation over the right SI joint. She did demonstrate a negative supine straight leg raise on exam. Nontender with normal range of motion in all extremities.  Neurologic:  Cranial nerves II through XII grossly intact. Normal toe dorsiflexion and foot eversion on exam. 1+ DTR on the right knee 2+ DTRs in the left knee. Antalgic gait without ataxia. Normal speech and language. No gross focal neurologic deficits are appreciated. Skin:  Skin is warm, dry and intact. No rash noted. Psychiatric: Mood and affect are normal. Patient  exhibits appropriate insight and judgment. ____________________________________________   RADIOLOGY  Deferred ____________________________________________  PROCEDURES  Toradol 15 mg IM Norflex 60 mg IM ____________________________________________  INITIAL IMPRESSION / ASSESSMENT AND PLAN / ED COURSE  Patient with an acute flare of her chronic low back pain with right lotion a referral. Symptoms seem consistent with sciatic irritation. She reports near resolution of symptoms at the time of discharge following eye medicine injection. She is discharged with a prescription for prednisone and Flexeril doses directed. She will follow with Dr. Sharlet Salina as scheduled next week for ongoing symptom management. Return precautions are reviewed.  Clinical Course   ____________________________________________  FINAL CLINICAL IMPRESSION(S) / ED DIAGNOSES  Final diagnoses:  Chronic back pain  DDD (degenerative disc disease), lumbosacral  Sciatica of right side      Melvenia Needles, PA-C 10/02/15 1655    Merlyn Lot, MD 10/02/15 2201

## 2015-10-02 NOTE — ED Notes (Signed)
AAOx3.  Skin warm and dry.  Ambulates with easy and steady gait.  Posture upright and relaxed. 

## 2015-10-07 DIAGNOSIS — M5416 Radiculopathy, lumbar region: Secondary | ICD-10-CM | POA: Diagnosis not present

## 2015-10-07 DIAGNOSIS — M7061 Trochanteric bursitis, right hip: Secondary | ICD-10-CM | POA: Diagnosis not present

## 2015-10-07 DIAGNOSIS — M5136 Other intervertebral disc degeneration, lumbar region: Secondary | ICD-10-CM | POA: Diagnosis not present

## 2015-10-21 DIAGNOSIS — I10 Essential (primary) hypertension: Secondary | ICD-10-CM | POA: Diagnosis not present

## 2015-10-21 DIAGNOSIS — N183 Chronic kidney disease, stage 3 (moderate): Secondary | ICD-10-CM | POA: Diagnosis not present

## 2015-10-21 DIAGNOSIS — I2510 Atherosclerotic heart disease of native coronary artery without angina pectoris: Secondary | ICD-10-CM | POA: Diagnosis not present

## 2015-10-27 DIAGNOSIS — I779 Disorder of arteries and arterioles, unspecified: Secondary | ICD-10-CM | POA: Insufficient documentation

## 2015-10-27 DIAGNOSIS — R42 Dizziness and giddiness: Secondary | ICD-10-CM | POA: Diagnosis not present

## 2015-10-27 DIAGNOSIS — I6523 Occlusion and stenosis of bilateral carotid arteries: Secondary | ICD-10-CM | POA: Diagnosis not present

## 2015-10-27 DIAGNOSIS — I447 Left bundle-branch block, unspecified: Secondary | ICD-10-CM | POA: Diagnosis not present

## 2015-10-27 DIAGNOSIS — I10 Essential (primary) hypertension: Secondary | ICD-10-CM | POA: Diagnosis not present

## 2015-10-27 DIAGNOSIS — R0602 Shortness of breath: Secondary | ICD-10-CM | POA: Diagnosis not present

## 2015-10-27 DIAGNOSIS — I25118 Atherosclerotic heart disease of native coronary artery with other forms of angina pectoris: Secondary | ICD-10-CM | POA: Diagnosis not present

## 2015-10-29 DIAGNOSIS — N183 Chronic kidney disease, stage 3 (moderate): Secondary | ICD-10-CM | POA: Diagnosis not present

## 2015-10-29 DIAGNOSIS — I25118 Atherosclerotic heart disease of native coronary artery with other forms of angina pectoris: Secondary | ICD-10-CM | POA: Diagnosis not present

## 2015-10-29 DIAGNOSIS — Z0000 Encounter for general adult medical examination without abnormal findings: Secondary | ICD-10-CM | POA: Diagnosis not present

## 2015-10-29 DIAGNOSIS — F319 Bipolar disorder, unspecified: Secondary | ICD-10-CM | POA: Diagnosis not present

## 2015-10-29 DIAGNOSIS — I779 Disorder of arteries and arterioles, unspecified: Secondary | ICD-10-CM | POA: Diagnosis not present

## 2015-11-18 DIAGNOSIS — I25118 Atherosclerotic heart disease of native coronary artery with other forms of angina pectoris: Secondary | ICD-10-CM | POA: Diagnosis not present

## 2015-11-18 DIAGNOSIS — I447 Left bundle-branch block, unspecified: Secondary | ICD-10-CM | POA: Diagnosis not present

## 2015-11-18 DIAGNOSIS — I6523 Occlusion and stenosis of bilateral carotid arteries: Secondary | ICD-10-CM | POA: Diagnosis not present

## 2015-11-23 DIAGNOSIS — I2510 Atherosclerotic heart disease of native coronary artery without angina pectoris: Secondary | ICD-10-CM | POA: Diagnosis not present

## 2015-11-23 DIAGNOSIS — I447 Left bundle-branch block, unspecified: Secondary | ICD-10-CM | POA: Diagnosis not present

## 2015-11-23 DIAGNOSIS — I38 Endocarditis, valve unspecified: Secondary | ICD-10-CM | POA: Diagnosis not present

## 2015-11-23 DIAGNOSIS — I779 Disorder of arteries and arterioles, unspecified: Secondary | ICD-10-CM | POA: Diagnosis not present

## 2015-11-23 DIAGNOSIS — E782 Mixed hyperlipidemia: Secondary | ICD-10-CM | POA: Diagnosis not present

## 2015-12-22 DIAGNOSIS — M7061 Trochanteric bursitis, right hip: Secondary | ICD-10-CM | POA: Diagnosis not present

## 2015-12-22 DIAGNOSIS — M7062 Trochanteric bursitis, left hip: Secondary | ICD-10-CM | POA: Diagnosis not present

## 2015-12-22 DIAGNOSIS — M5136 Other intervertebral disc degeneration, lumbar region: Secondary | ICD-10-CM | POA: Diagnosis not present

## 2016-01-14 DIAGNOSIS — M48062 Spinal stenosis, lumbar region with neurogenic claudication: Secondary | ICD-10-CM | POA: Diagnosis not present

## 2016-01-14 DIAGNOSIS — M5136 Other intervertebral disc degeneration, lumbar region: Secondary | ICD-10-CM | POA: Diagnosis not present

## 2016-01-14 DIAGNOSIS — M5416 Radiculopathy, lumbar region: Secondary | ICD-10-CM | POA: Diagnosis not present

## 2016-02-12 DIAGNOSIS — L309 Dermatitis, unspecified: Secondary | ICD-10-CM | POA: Diagnosis not present

## 2016-02-12 DIAGNOSIS — L820 Inflamed seborrheic keratosis: Secondary | ICD-10-CM | POA: Diagnosis not present

## 2016-03-21 DIAGNOSIS — M7061 Trochanteric bursitis, right hip: Secondary | ICD-10-CM | POA: Diagnosis not present

## 2016-03-21 DIAGNOSIS — M5136 Other intervertebral disc degeneration, lumbar region: Secondary | ICD-10-CM | POA: Diagnosis not present

## 2016-03-21 DIAGNOSIS — M7062 Trochanteric bursitis, left hip: Secondary | ICD-10-CM | POA: Diagnosis not present

## 2016-03-21 DIAGNOSIS — M5416 Radiculopathy, lumbar region: Secondary | ICD-10-CM | POA: Diagnosis not present

## 2016-04-11 DIAGNOSIS — G629 Polyneuropathy, unspecified: Secondary | ICD-10-CM | POA: Diagnosis not present

## 2016-04-11 DIAGNOSIS — I779 Disorder of arteries and arterioles, unspecified: Secondary | ICD-10-CM | POA: Diagnosis not present

## 2016-04-11 DIAGNOSIS — F319 Bipolar disorder, unspecified: Secondary | ICD-10-CM | POA: Diagnosis not present

## 2016-04-11 DIAGNOSIS — M1990 Unspecified osteoarthritis, unspecified site: Secondary | ICD-10-CM | POA: Diagnosis not present

## 2016-04-11 DIAGNOSIS — I2510 Atherosclerotic heart disease of native coronary artery without angina pectoris: Secondary | ICD-10-CM | POA: Diagnosis not present

## 2016-04-11 DIAGNOSIS — N183 Chronic kidney disease, stage 3 (moderate): Secondary | ICD-10-CM | POA: Diagnosis not present

## 2016-06-03 DIAGNOSIS — M5416 Radiculopathy, lumbar region: Secondary | ICD-10-CM | POA: Diagnosis not present

## 2016-06-03 DIAGNOSIS — M5136 Other intervertebral disc degeneration, lumbar region: Secondary | ICD-10-CM | POA: Diagnosis not present

## 2016-06-03 DIAGNOSIS — M1612 Unilateral primary osteoarthritis, left hip: Secondary | ICD-10-CM | POA: Diagnosis not present

## 2016-06-03 DIAGNOSIS — M48062 Spinal stenosis, lumbar region with neurogenic claudication: Secondary | ICD-10-CM | POA: Diagnosis not present

## 2016-06-03 DIAGNOSIS — M7062 Trochanteric bursitis, left hip: Secondary | ICD-10-CM | POA: Diagnosis not present

## 2016-06-06 ENCOUNTER — Encounter: Payer: Self-pay | Admitting: *Deleted

## 2016-06-06 ENCOUNTER — Emergency Department
Admission: EM | Admit: 2016-06-06 | Discharge: 2016-06-07 | Disposition: A | Discharge status: Home / Self Care | Payer: PPO | Attending: Emergency Medicine | Admitting: Emergency Medicine

## 2016-06-06 DIAGNOSIS — M5432 Sciatica, left side: Secondary | ICD-10-CM

## 2016-06-06 DIAGNOSIS — Z7982 Long term (current) use of aspirin: Secondary | ICD-10-CM | POA: Diagnosis not present

## 2016-06-06 DIAGNOSIS — M5442 Lumbago with sciatica, left side: Secondary | ICD-10-CM | POA: Insufficient documentation

## 2016-06-06 DIAGNOSIS — M25552 Pain in left hip: Secondary | ICD-10-CM | POA: Diagnosis not present

## 2016-06-06 DIAGNOSIS — I2510 Atherosclerotic heart disease of native coronary artery without angina pectoris: Secondary | ICD-10-CM | POA: Insufficient documentation

## 2016-06-06 DIAGNOSIS — Z87891 Personal history of nicotine dependence: Secondary | ICD-10-CM | POA: Insufficient documentation

## 2016-06-06 MED ORDER — DIAZEPAM 2 MG PO TABS
2.0000 mg | ORAL_TABLET | Freq: Once | ORAL | Status: AC
  Administered 2016-06-07: 2 mg via ORAL
  Filled 2016-06-06: qty 1

## 2016-06-06 MED ORDER — ONDANSETRON 4 MG PO TBDP
4.0000 mg | ORAL_TABLET | Freq: Once | ORAL | Status: AC
  Administered 2016-06-07: 4 mg via ORAL
  Filled 2016-06-06: qty 1

## 2016-06-06 MED ORDER — MORPHINE SULFATE (PF) 2 MG/ML IV SOLN
2.0000 mg | Freq: Once | INTRAVENOUS | Status: AC
  Administered 2016-06-07: 2 mg via INTRAMUSCULAR
  Filled 2016-06-06: qty 1

## 2016-06-06 NOTE — ED Triage Notes (Signed)
Pt has chronic left hip pain, pt receives Cortisone injections in left hip, last injection on Friday, pt denies any falls , pt reports pain is worse today, pt is able to ambulate

## 2016-06-06 NOTE — ED Triage Notes (Signed)
Pt took Hydrocodone at 9pm

## 2016-06-06 NOTE — ED Provider Notes (Signed)
[facility] Emergency Department Provider Note   ____________________________________________   First MD Initiated Contact with Patient 06/06/16 2339     (approximate)  I have reviewed the triage vital signs and the nursing notes.   HISTORY  Chief Complaint Hip Pain    HPI Madeline Cabrera is a 71 y.o. female who presents to the ED from home with a chief complaint of acute on chronic left hip and lower back pain. Patient received cortisone injection in her left hip on 6/1; hydrocodone was refilled on 5/31. Complains of cortisone injection not helping this time and progressive pain in her left lower back/left hip. Denies radiation to leg, extremity weakness, numbness/tingling or bowel/bladder incontinence. Able to ambulate but with pain.Denies fever, chills, chest pain, shortness of breath, abdominal pain, nausea, vomiting, diarrhea. Denies recent travel or trauma.   Past Medical History:  Diagnosis Date  . Anxiety   . Arthritis   . Coronary artery disease   . GERD (gastroesophageal reflux disease)   . Headache(784.0)   . Hyperlipidemia   . PONV (postoperative nausea and vomiting)     There are no active problems to display for this patient.   Past Surgical History:  Procedure Laterality Date  . ABDOMINAL HYSTERECTOMY    . ARTHRODESIS METATARSALPHALANGEAL JOINT (MTPJ) Left 01/09/2015   Procedure: ARTHRODESIS METATARSALPHALANGEAL JOINT (MTPJ);  Surgeon: Albertine Myonna, DPM;  Location: [facility] ORS;  Service: Podiatry;  Laterality: Left;  . CARDIAC CATHETERIZATION    . CATARACT EXTRACTION Bilateral   . CORONARY ANGIOPLASTY WITH STENT PLACEMENT    . heel spurs Right   . TRIGGER FINGER RELEASE Bilateral     Prior to Admission medications   Medication Sig Start Date End Date Taking? Authorizing Provider  aspirin 81 MG tablet Take 81 mg by mouth daily.    [provider]  cholecalciferol (VITAMIN D) 1000 UNITS tablet Take 1,000 Units by mouth  daily.    [provider]  citalopram (CELEXA) 20 MG tablet Take 40 mg by mouth daily.     [provider]  cyclobenzaprine (FLEXERIL) 5 MG tablet Take 1 tablet (5 mg total) by mouth 3 (three) times daily as needed for muscle spasms. 10/02/15   Menshew, Dannielle Karvonen, PA-C  DiphenhydrAMINE HCl (ALLERGY MEDICATION PO) Take 1 tablet by mouth every evening.     [provider]  docusate sodium (COLACE) 250 MG capsule Take 250 mg by mouth at bedtime.    [provider]  estrogens, conjugated, (PREMARIN) 1.25 MG tablet Take 1.25 mg by mouth every 7 (seven) days. Reported on 12/24/2014    [provider]  Hypromellose (GENTEAL OP) Apply 2 drops to eye 2 (two) times daily.    [provider]  lovastatin (MEVACOR) 40 MG tablet Take 40 mg by mouth at bedtime.    [provider]  omeprazole (PRILOSEC) 40 MG capsule Take 40 mg by mouth 2 (two) times daily.    [provider]  oxyCODONE-acetaminophen (PERCOCET) 7.5-325 MG tablet Take 1 tablet by mouth every 4 (four) hours as needed for severe pain. 01/09/15   Troxler, Rodman Key, DPM  predniSONE (DELTASONE) 10 MG tablet Take 1 tablet (10 mg total) by mouth 2 (two) times daily with a meal. 10/02/15   Menshew, Dannielle Karvonen, PA-C  scopolamine (TRANSDERM-SCOP) 1 MG/3DAYS Place 1 patch (1.5 mg total) onto the skin every 3 (three) days. 01/09/15   Troxler, Rodman Key, DPM  traMADol (ULTRAM) 50 MG tablet Take by mouth 2 (  two) times daily as needed.    [provider]  White Petrolatum-Mineral Oil (GENTEAL PM) 85-15 % OINT Apply 1 application to eye every evening.    [provider]    Allergies Clindamycin/lincomycin; Doxycycline; Hydrocodone-guaifenesin; Minocycline; Naprosyn [naproxen]; Biaxin [clarithromycin]; and Penicillins  No family history on file.  Social History Social History  Substance Use Topics  . Smoking status: Former Smoker    Packs/day: 0.25    Years: 2.00     Quit date: 01/03/2002  . Smokeless tobacco: Never Used  . Alcohol use No    Review of Systems  Constitutional: No fever/chills. Eyes: No visual changes. ENT: No sore throat. Cardiovascular: Denies chest pain. Respiratory: Denies shortness of breath. Gastrointestinal: No abdominal pain.  No nausea, no vomiting.  No diarrhea.  No constipation. Genitourinary: Negative for dysuria. Musculoskeletal: Positive for back and left hip pain. Skin: Negative for rash. Neurological: Negative for headaches, focal weakness or numbness.   ____________________________________________   PHYSICAL EXAM:  VITAL SIGNS: ED Triage Vitals [06/06/16 2213]  Enc Vitals Group     BP (!) 169/84     Pulse Rate 96     Resp 18     Temp 98.2 F (36.8 C)     Temp Source Oral     SpO2 98 %     Weight 164 lb (74.4 kg)     Height 5\' 3"  (1.6 m)     Head Circumference      Peak Flow      Pain Score 10     Pain Loc      Pain Edu?      Excl. in [place]?     Constitutional: Alert and oriented. Well appearing and in mild acute distress. Eyes: Conjunctivae are normal. PERRL. EOMI. Head: Atraumatic. Nose: No congestion/rhinnorhea. Mouth/Throat: Mucous membranes are moist.  Oropharynx non-erythematous. Neck: No stridor.  No cervical spine tenderness to palpation. Cardiovascular: Normal rate, regular rhythm. Grossly normal heart sounds.  Good peripheral circulation. Respiratory: Normal respiratory effort.  No retractions. Lungs CTAB. Gastrointestinal: Soft and nontender. No distention. No abdominal bruits. No CVA tenderness. Musculoskeletal: No spinal tenderness to palpation. Left lumbar paraspinal tenderness to palpation with muscle spasm. Left buttock is not tender to palpation. Negative straight leg raise. 2+femoral and distal pulses.  Neurologic:  Normal speech and language. No gross focal neurologic deficits are appreciated.  Skin:  Skin is warm, dry and intact. No rash noted. Psychiatric: Mood and affect are  normal. Speech and behavior are normal.  ____________________________________________   LABS (all labs ordered are listed, but only abnormal results are displayed)  Labs Reviewed - No data to display ____________________________________________  EKG  None ____________________________________________  RADIOLOGY  No results found.  ____________________________________________   PROCEDURES  Procedure(s) performed: None  Procedures  Critical Care performed: No  ____________________________________________   INITIAL IMPRESSION / ASSESSMENT AND PLAN / ED COURSE  Pertinent labs & imaging results that were available during my care of the patient were reviewed by me and considered in my medical decision making (see chart for details).  71 year old female who presents with acute on chronic left lower back and hip pain. No relief with cortisone injection nor Norco taken at 9pm. Patient denies fall/injury/trauma. Will administer IM analgesia, muscle relaxer and reassess.  Clinical Course as of Jun 07 524  Tue Jun 07, 2016  0045 Patient reports no relief after IM morphine. Will try low-dose Dilaudid. States she had x-rays done by Dr. Bertram Millard on 6/1 which  were negative.  [JS]  0158 Feeling much better after IM dilaudid. Strict return precautions given. Patient verbalizes understanding and agrees with plan of care.  [JS]    Clinical Course User Index [JS] Paulette Blanch, MD     ____________________________________________   FINAL CLINICAL IMPRESSION(S) / ED DIAGNOSES  Final diagnoses:  Left hip pain  Sciatica of left side      NEW MEDICATIONS STARTED DURING THIS VISIT:  Discharge Medication List as of 06/07/2016  2:00 AM       Note:  This document was prepared using Dragon voice recognition software and may include unintentional dictation errors.    Paulette Blanch, MD 06/07/16 [phone]

## 2016-06-07 ENCOUNTER — Other Ambulatory Visit: Payer: Self-pay | Admitting: Physical Medicine and Rehabilitation

## 2016-06-07 DIAGNOSIS — M25552 Pain in left hip: Secondary | ICD-10-CM

## 2016-06-07 DIAGNOSIS — M5416 Radiculopathy, lumbar region: Secondary | ICD-10-CM

## 2016-06-07 MED ORDER — HYDROMORPHONE HCL 1 MG/ML IJ SOLN
1.0000 mg | Freq: Once | INTRAMUSCULAR | Status: AC
  Administered 2016-06-07: 1 mg via INTRAMUSCULAR
  Filled 2016-06-07: qty 1

## 2016-06-07 NOTE — Discharge Instructions (Signed)
1. Please call Dr. Sharlet Salina' office this morning to discuss her pain management. 2. Return to the ER for worsening symptoms, persistent vomiting, difficulty breathing or other concerns.

## 2016-06-08 ENCOUNTER — Encounter: Payer: Self-pay | Admitting: Emergency Medicine

## 2016-06-08 DIAGNOSIS — I2510 Atherosclerotic heart disease of native coronary artery without angina pectoris: Secondary | ICD-10-CM | POA: Insufficient documentation

## 2016-06-08 DIAGNOSIS — M25552 Pain in left hip: Secondary | ICD-10-CM | POA: Diagnosis not present

## 2016-06-08 DIAGNOSIS — Z79899 Other long term (current) drug therapy: Secondary | ICD-10-CM | POA: Diagnosis not present

## 2016-06-08 DIAGNOSIS — M5432 Sciatica, left side: Secondary | ICD-10-CM | POA: Insufficient documentation

## 2016-06-08 DIAGNOSIS — M5442 Lumbago with sciatica, left side: Secondary | ICD-10-CM | POA: Diagnosis not present

## 2016-06-08 DIAGNOSIS — Z7982 Long term (current) use of aspirin: Secondary | ICD-10-CM | POA: Diagnosis not present

## 2016-06-08 DIAGNOSIS — Z87891 Personal history of nicotine dependence: Secondary | ICD-10-CM | POA: Insufficient documentation

## 2016-06-08 DIAGNOSIS — M1612 Unilateral primary osteoarthritis, left hip: Secondary | ICD-10-CM | POA: Diagnosis not present

## 2016-06-08 NOTE — ED Triage Notes (Signed)
Pt presents to ED with left hip pain. No known injury. Seen in this ED on Monday for the same and has an MRI scheduled on the 15th. Pt states she doesn't think she can wait that long. Has been taking her prescribed pain medication without relief.

## 2016-06-09 ENCOUNTER — Emergency Department: Payer: PPO

## 2016-06-09 ENCOUNTER — Emergency Department
Admission: EM | Admit: 2016-06-09 | Discharge: 2016-06-09 | Disposition: A | Discharge status: Home / Self Care | Payer: PPO | Attending: Emergency Medicine | Admitting: Emergency Medicine

## 2016-06-09 DIAGNOSIS — M5432 Sciatica, left side: Secondary | ICD-10-CM

## 2016-06-09 DIAGNOSIS — G8929 Other chronic pain: Secondary | ICD-10-CM

## 2016-06-09 DIAGNOSIS — M1612 Unilateral primary osteoarthritis, left hip: Secondary | ICD-10-CM | POA: Diagnosis not present

## 2016-06-09 DIAGNOSIS — M5442 Lumbago with sciatica, left side: Secondary | ICD-10-CM

## 2016-06-09 MED ORDER — LIDOCAINE 5 % EX PTCH: 1.0000 | MEDICATED_PATCH | Freq: Two times a day (BID) | CUTANEOUS | 0 refills | Status: AC

## 2016-06-09 MED ORDER — LIDOCAINE 5 % EX PTCH
1.0000 | MEDICATED_PATCH | CUTANEOUS | Status: DC
  Administered 2016-06-09: 1 via TRANSDERMAL
  Filled 2016-06-09: qty 1

## 2016-06-09 MED ORDER — ETODOLAC 200 MG PO CAPS: 200.0000 mg | ORAL_CAPSULE | Freq: Three times a day (TID) | ORAL | 0 refills | Status: DC

## 2016-06-09 MED ORDER — KETOROLAC TROMETHAMINE 60 MG/2ML IM SOLN
60.0000 mg | Freq: Once | INTRAMUSCULAR | Status: AC
  Administered 2016-06-09: 60 mg via INTRAMUSCULAR
  Filled 2016-06-09: qty 2

## 2016-06-09 MED ORDER — OXYCODONE-ACETAMINOPHEN 5-325 MG PO TABS
1.0000 | ORAL_TABLET | Freq: Once | ORAL | Status: AC
  Administered 2016-06-09: 1 via ORAL
  Filled 2016-06-09: qty 1

## 2016-06-09 MED ORDER — OXYCODONE-ACETAMINOPHEN 5-325 MG PO TABS: 1.0000 | ORAL_TABLET | Freq: Four times a day (QID) | ORAL | 0 refills | Status: DC | PRN

## 2016-06-09 NOTE — ED Provider Notes (Signed)
[facility] Emergency Department Provider Note   ____________________________________________   First MD Initiated Contact with Patient 06/09/16 0111     (approximate)  I have reviewed the triage vital signs and the nursing notes.   HISTORY  Chief Complaint Hip Pain and Back Pain    HPI Madeline Cabrera is a 71 y.o. female who comes into the hospital tonight with some hip pain. The patient was here Monday night with some back pain and left hip pain. She states that the pain is going down her leg and into her knee. She's been taking hydrocodone for this pain but she reports that it doesn't happen. The patient states that her physician told her she had arthritis and bursitis but she is unsure. She has been given shots in her back in the past but she states that they're not working. She's had this specific pain in her hip for about a week. The patient sees her orthopedic surgeon and reports she is receiving shots in her back for the past 4-5 years. The patient has an MRI scheduled to on June 15. The patient was told to increase her hydrocodone at home as well as take muscle relaxers. She states that she doesn't take the muscle relaxers often but she has been taking the hydrocodone. She states that she does have some weakness to her leg but states again that it is due to the pain. The patient rates her pain a 10 out of 10 in intensity. The patient has had some difficulty moving her bowels ever since she started on the hydrocodone. She is here today for treatment and evaluation.   Past Medical History:  Diagnosis Date  . Anxiety   . Arthritis   . Coronary artery disease   . GERD (gastroesophageal reflux disease)   . Headache(784.0)   . Hyperlipidemia   . PONV (postoperative nausea and vomiting)     There are no active problems to display for this patient.   Past Surgical History:  Procedure Laterality Date  . ABDOMINAL HYSTERECTOMY    . ARTHRODESIS  METATARSALPHALANGEAL JOINT (MTPJ) Left 01/09/2015   Procedure: ARTHRODESIS METATARSALPHALANGEAL JOINT (MTPJ);  Surgeon: Albertine Bianna, DPM;  Location: [facility] ORS;  Service: Podiatry;  Laterality: Left;  . CARDIAC CATHETERIZATION    . CATARACT EXTRACTION Bilateral   . CORONARY ANGIOPLASTY WITH STENT PLACEMENT    . heel spurs Right   . TRIGGER FINGER RELEASE Bilateral     Prior to Admission medications   Medication Sig Start Date End Date Taking? Authorizing Provider  aspirin 81 MG tablet Take 81 mg by mouth daily.    [provider]  cholecalciferol (VITAMIN D) 1000 UNITS tablet Take 1,000 Units by mouth daily.    [provider]  citalopram (CELEXA) 20 MG tablet Take 40 mg by mouth daily.     [provider]  cyclobenzaprine (FLEXERIL) 5 MG tablet Take 1 tablet (5 mg total) by mouth 3 (three) times daily as needed for muscle spasms. 10/02/15   Menshew, Dannielle Karvonen, PA-C  DiphenhydrAMINE HCl (ALLERGY MEDICATION PO) Take 1 tablet by mouth every evening.     [provider]  docusate sodium (COLACE) 250 MG capsule Take 250 mg by mouth at bedtime.    [provider]  estrogens, conjugated, (PREMARIN) 1.25 MG tablet Take 1.25 mg by mouth every 7 (seven) days. Reported on 12/24/2014    [provider]  etodolac (LODINE) 200 MG capsule Take 1 capsule (200 mg total)  by mouth every 8 (eight) hours. 06/09/16   Loney Hering, MD  Hypromellose (GENTEAL OP) Apply 2 drops to eye 2 (two) times daily.    [provider]  lidocaine (LIDODERM) 5 % Place 1 patch onto the skin every 12 (twelve) hours. Remove & Discard patch within 12 hours or as directed by MD 06/09/16 06/09/17  Loney Hering, MD  lovastatin (MEVACOR) 40 MG tablet Take 40 mg by mouth at bedtime.    [provider]  omeprazole (PRILOSEC) 40 MG capsule Take 40 mg by mouth 2 (two) times daily.    [provider]  oxyCODONE-acetaminophen (PERCOCET) 7.5-325 MG tablet  Take 1 tablet by mouth every 4 (four) hours as needed for severe pain. 01/09/15   Troxler, Rodman Key, DPM  oxyCODONE-acetaminophen (ROXICET) 5-325 MG tablet Take 1 tablet by mouth every 6 (six) hours as needed. 06/09/16   Loney Hering, MD  predniSONE (DELTASONE) 10 MG tablet Take 1 tablet (10 mg total) by mouth 2 (two) times daily with a meal. 10/02/15   Menshew, Dannielle Karvonen, PA-C  scopolamine (TRANSDERM-SCOP) 1 MG/3DAYS Place 1 patch (1.5 mg total) onto the skin every 3 (three) days. 01/09/15   Troxler, Rodman Key, DPM  traMADol (ULTRAM) 50 MG tablet Take by mouth 2 (two) times daily as needed.    [provider]  White Petrolatum-Mineral Oil (GENTEAL PM) 85-15 % OINT Apply 1 application to eye every evening.    [provider]    Allergies Clindamycin/lincomycin; Doxycycline; Hydrocodone-guaifenesin; Minocycline; Naprosyn [naproxen]; Biaxin [clarithromycin]; and Penicillins  No family history on file.  Social History Social History  Substance Use Topics  . Smoking status: Former Smoker    Packs/day: 0.25    Years: 2.00    Quit date: 01/03/2002  . Smokeless tobacco: Never Used  . Alcohol use No    Review of Systems  Constitutional: No fever/chills Eyes: No visual changes. ENT: No sore throat. Cardiovascular: Denies chest pain. Respiratory: Denies shortness of breath. Gastrointestinal: No abdominal pain.  No nausea, no vomiting.  No diarrhea.  No constipation. Genitourinary: Negative for dysuria. Musculoskeletal: Back pain and left hip pain Skin: Negative for rash. Neurological: Negative for headaches, focal weakness or numbness.   ____________________________________________   PHYSICAL EXAM:  VITAL SIGNS: ED Triage Vitals [06/08/16 2252]  Enc Vitals Group     BP (!) 162/100     Pulse Rate 98     Resp 18     Temp 97.5 F (36.4 C)     Temp Source Oral     SpO2 98 %     Weight 164 lb (74.4 kg)     Height 5\' 3"  (1.6 m)     Head Circumference      Peak  Flow      Pain Score 8     Pain Loc      Pain Edu?      Excl. in [place]?     Constitutional: Alert and oriented. Well appearing and in Moderate distress. Eyes: Conjunctivae are normal. PERRL. EOMI. Head: Atraumatic. Nose: No congestion/rhinnorhea. Mouth/Throat: Mucous membranes are moist.  Oropharynx non-erythematous. Cardiovascular: Normal rate, regular rhythm. Grossly normal heart sounds.  Good peripheral circulation. Respiratory: Normal respiratory effort.  No retractions. Lungs CTAB. Gastrointestinal: Soft and nontender. No distention. Positive bowel sounds Musculoskeletal: Left SI joint tenderness to palpation, left hip tenderness to palpation negative straight leg raise   Neurologic:  Normal speech and language.  Skin:  Skin is warm, dry and intact.  Psychiatric: Mood and affect are normal.   ____________________________________________   LABS (all labs ordered are listed, but only abnormal results are displayed)  Labs Reviewed - No data to display ____________________________________________  EKG  none ____________________________________________  RADIOLOGY  Dg Hip Unilat W Or Wo Pelvis 2-3 Views Left  Result Date: 06/09/2016 CLINICAL DATA:  Left hip pain.  No known injury. EXAM: DG HIP (WITH OR WITHOUT PELVIS) 2-3V LEFT COMPARISON:  None. FINDINGS: The cortical margins of the bony pelvis and left hip are intact. No fracture. Pubic symphysis and sacroiliac joints are congruent. Both femoral heads are well-seated in the respective acetabula. Mild osteoarthritis of both hips with acetabular spurring. No radiographic evidence of avascular necrosis. Postsurgical change at the lumbosacral junction, partially included. IMPRESSION: Mild osteoarthritis of both hips. No acute or suspicious abnormality. Electronically Signed   By: Jeb Levering M.D.   On: 06/09/2016 03:22    ____________________________________________   PROCEDURES  Procedure(s) performed:  None  Procedures  Critical Care performed: No  ____________________________________________   INITIAL IMPRESSION / ASSESSMENT AND PLAN / ED COURSE  Pertinent labs & imaging results that were available during my care of the patient were reviewed by me and considered in my medical decision making (see chart for details).  This is a 71 year old who comes into the hospital today with some back pain and left hip pain. The patient has been having this pain over a week and she has been followed by her orthopedic doctor. I did give the patient some Toradol, Lidoderm patch and Percocet. I also sent the patient for an x-ray. The x-ray showed some osteoarthritis and her pain improved after the medicine. I feel that the patient does need to follow-up with her orthopedic surgeon for further evaluation of this pain. She'll be discharged home.  Clinical Course as of Jun 09 437  Thu Jun 09, 2016  0331 Mild osteoarthritis of both hips. No acute or suspicious abnormality.   DG Hip Unilat W or Wo Pelvis 2-3 Views Left [AW]    Clinical Course User Index [AW] Loney Hering, MD     ____________________________________________   FINAL CLINICAL IMPRESSION(S) / ED DIAGNOSES  Final diagnoses:  Sciatica of left side  Chronic left-sided low back pain with left-sided sciatica      NEW MEDICATIONS STARTED DURING THIS VISIT:  Discharge Medication List as of 06/09/2016  3:46 AM    START taking these medications   Details  etodolac (LODINE) 200 MG capsule Take 1 capsule (200 mg total) by mouth every 8 (eight) hours., Starting Thu 06/09/2016, Print    lidocaine (LIDODERM) 5 % Place 1 patch onto the skin every 12 (twelve) hours. Remove & Discard patch within 12 hours or as directed by MD, Starting Thu 06/09/2016, Until Fri 06/09/2017, Print    oxyCODONE-acetaminophen (ROXICET) 5-325 MG tablet Take 1 tablet by mouth every 6 (six) hours as needed., Starting Thu 06/09/2016, Print         Note:  This  document was prepared using Dragon voice recognition software and may include unintentional dictation errors.    Loney Hering, MD 06/09/16 [phone]

## 2016-06-14 ENCOUNTER — Ambulatory Visit: Payer: PPO

## 2016-06-17 ENCOUNTER — Ambulatory Visit: Payer: PPO

## 2016-06-22 DIAGNOSIS — J40 Bronchitis, not specified as acute or chronic: Secondary | ICD-10-CM | POA: Diagnosis not present

## 2016-06-24 ENCOUNTER — Ambulatory Visit: Payer: PPO

## 2016-07-01 ENCOUNTER — Ambulatory Visit: Payer: PPO

## 2016-07-07 ENCOUNTER — Other Ambulatory Visit
Admission: RE | Admit: 2016-07-07 | Discharge: 2016-07-07 | Disposition: A | Discharge status: Home / Self Care | Payer: PPO | Source: Ambulatory Visit | Attending: Internal Medicine | Admitting: Internal Medicine

## 2016-07-07 DIAGNOSIS — R079 Chest pain, unspecified: Secondary | ICD-10-CM | POA: Diagnosis not present

## 2016-07-07 DIAGNOSIS — R0602 Shortness of breath: Secondary | ICD-10-CM | POA: Diagnosis not present

## 2016-07-07 LAB — FIBRIN DERIVATIVES D-DIMER (ARMC ONLY): Fibrin derivatives D-dimer (ARMC): 867.15 — ABNORMAL HIGH (ref 0.00–499.00)

## 2016-07-07 LAB — TROPONIN I

## 2016-07-08 ENCOUNTER — Ambulatory Visit
Admission: RE | Admit: 2016-07-08 | Discharge: 2016-07-08 | Disposition: A | Discharge status: Home / Self Care | Payer: PPO | Source: Ambulatory Visit | Attending: Internal Medicine | Admitting: Internal Medicine

## 2016-07-08 ENCOUNTER — Other Ambulatory Visit: Payer: Self-pay | Admitting: Internal Medicine

## 2016-07-08 DIAGNOSIS — I700 Atherosclerosis of aorta: Secondary | ICD-10-CM | POA: Diagnosis not present

## 2016-07-08 DIAGNOSIS — R7989 Other specified abnormal findings of blood chemistry: Secondary | ICD-10-CM

## 2016-07-08 DIAGNOSIS — R079 Chest pain, unspecified: Secondary | ICD-10-CM

## 2016-07-08 HISTORY — DX: Disorder of kidney and ureter, unspecified: N28.9

## 2016-07-08 LAB — POCT I-STAT CREATININE: Creatinine, Ser: 1.1 mg/dL — ABNORMAL HIGH (ref 0.44–1.00)

## 2016-07-08 MED ORDER — IOPAMIDOL (ISOVUE-370) INJECTION 76%
75.0000 mL | Freq: Once | INTRAVENOUS | Status: AC | PRN
  Administered 2016-07-08: 75 mL via INTRAVENOUS

## 2016-07-26 DIAGNOSIS — M48062 Spinal stenosis, lumbar region with neurogenic claudication: Secondary | ICD-10-CM | POA: Diagnosis not present

## 2016-07-26 DIAGNOSIS — M5416 Radiculopathy, lumbar region: Secondary | ICD-10-CM | POA: Diagnosis not present

## 2016-07-26 DIAGNOSIS — M5136 Other intervertebral disc degeneration, lumbar region: Secondary | ICD-10-CM | POA: Diagnosis not present

## 2016-08-23 DIAGNOSIS — M5416 Radiculopathy, lumbar region: Secondary | ICD-10-CM | POA: Diagnosis not present

## 2016-08-23 DIAGNOSIS — M5136 Other intervertebral disc degeneration, lumbar region: Secondary | ICD-10-CM | POA: Diagnosis not present

## 2016-08-23 DIAGNOSIS — M48062 Spinal stenosis, lumbar region with neurogenic claudication: Secondary | ICD-10-CM | POA: Diagnosis not present

## 2016-10-17 DIAGNOSIS — M5416 Radiculopathy, lumbar region: Secondary | ICD-10-CM | POA: Diagnosis not present

## 2016-10-17 DIAGNOSIS — M5136 Other intervertebral disc degeneration, lumbar region: Secondary | ICD-10-CM | POA: Diagnosis not present

## 2016-10-17 DIAGNOSIS — M48062 Spinal stenosis, lumbar region with neurogenic claudication: Secondary | ICD-10-CM | POA: Diagnosis not present

## 2016-10-27 DIAGNOSIS — I779 Disorder of arteries and arterioles, unspecified: Secondary | ICD-10-CM | POA: Diagnosis not present

## 2016-10-27 DIAGNOSIS — N183 Chronic kidney disease, stage 3 (moderate): Secondary | ICD-10-CM | POA: Diagnosis not present

## 2016-10-27 DIAGNOSIS — I2510 Atherosclerotic heart disease of native coronary artery without angina pectoris: Secondary | ICD-10-CM | POA: Diagnosis not present

## 2016-10-31 DIAGNOSIS — I2510 Atherosclerotic heart disease of native coronary artery without angina pectoris: Secondary | ICD-10-CM | POA: Diagnosis not present

## 2016-10-31 DIAGNOSIS — E782 Mixed hyperlipidemia: Secondary | ICD-10-CM | POA: Diagnosis not present

## 2016-10-31 DIAGNOSIS — M810 Age-related osteoporosis without current pathological fracture: Secondary | ICD-10-CM | POA: Diagnosis not present

## 2016-10-31 DIAGNOSIS — Z1231 Encounter for screening mammogram for malignant neoplasm of breast: Secondary | ICD-10-CM | POA: Diagnosis not present

## 2016-10-31 DIAGNOSIS — Z0000 Encounter for general adult medical examination without abnormal findings: Secondary | ICD-10-CM | POA: Diagnosis not present

## 2016-10-31 DIAGNOSIS — I779 Disorder of arteries and arterioles, unspecified: Secondary | ICD-10-CM | POA: Diagnosis not present

## 2016-10-31 DIAGNOSIS — F319 Bipolar disorder, unspecified: Secondary | ICD-10-CM | POA: Diagnosis not present

## 2016-10-31 DIAGNOSIS — I700 Atherosclerosis of aorta: Secondary | ICD-10-CM | POA: Diagnosis not present

## 2016-11-10 ENCOUNTER — Other Ambulatory Visit: Payer: Self-pay | Admitting: Internal Medicine

## 2016-11-10 DIAGNOSIS — Z1231 Encounter for screening mammogram for malignant neoplasm of breast: Secondary | ICD-10-CM

## 2016-11-16 DIAGNOSIS — L02619 Cutaneous abscess of unspecified foot: Secondary | ICD-10-CM | POA: Diagnosis not present

## 2016-11-16 DIAGNOSIS — S93313A Subluxation of tarsal joint of unspecified foot, initial encounter: Secondary | ICD-10-CM | POA: Diagnosis not present

## 2016-11-16 DIAGNOSIS — L03119 Cellulitis of unspecified part of limb: Secondary | ICD-10-CM | POA: Diagnosis not present

## 2016-11-16 DIAGNOSIS — M7751 Other enthesopathy of right foot: Secondary | ICD-10-CM | POA: Diagnosis not present

## 2016-11-16 DIAGNOSIS — M79671 Pain in right foot: Secondary | ICD-10-CM | POA: Diagnosis not present

## 2016-11-16 DIAGNOSIS — H353211 Exudative age-related macular degeneration, right eye, with active choroidal neovascularization: Secondary | ICD-10-CM | POA: Diagnosis not present

## 2016-11-16 DIAGNOSIS — M79672 Pain in left foot: Secondary | ICD-10-CM | POA: Diagnosis not present

## 2016-12-01 ENCOUNTER — Ambulatory Visit
Admission: RE | Admit: 2016-12-01 | Discharge: 2016-12-01 | Disposition: A | Discharge status: Home / Self Care | Payer: PPO | Source: Ambulatory Visit | Attending: Internal Medicine | Admitting: Internal Medicine

## 2016-12-01 DIAGNOSIS — Z1231 Encounter for screening mammogram for malignant neoplasm of breast: Secondary | ICD-10-CM

## 2016-12-15 DIAGNOSIS — H353211 Exudative age-related macular degeneration, right eye, with active choroidal neovascularization: Secondary | ICD-10-CM | POA: Diagnosis not present

## 2017-01-06 DIAGNOSIS — M79671 Pain in right foot: Secondary | ICD-10-CM | POA: Diagnosis not present

## 2017-01-06 DIAGNOSIS — G603 Idiopathic progressive neuropathy: Secondary | ICD-10-CM | POA: Diagnosis not present

## 2017-01-06 DIAGNOSIS — M79672 Pain in left foot: Secondary | ICD-10-CM | POA: Diagnosis not present

## 2017-01-11 ENCOUNTER — Other Ambulatory Visit: Payer: Self-pay | Admitting: Family Medicine

## 2017-01-11 DIAGNOSIS — M5416 Radiculopathy, lumbar region: Secondary | ICD-10-CM

## 2017-01-11 DIAGNOSIS — M48062 Spinal stenosis, lumbar region with neurogenic claudication: Secondary | ICD-10-CM | POA: Diagnosis not present

## 2017-01-11 DIAGNOSIS — M5136 Other intervertebral disc degeneration, lumbar region: Secondary | ICD-10-CM | POA: Diagnosis not present

## 2017-01-17 DIAGNOSIS — H353211 Exudative age-related macular degeneration, right eye, with active choroidal neovascularization: Secondary | ICD-10-CM | POA: Diagnosis not present

## 2017-01-18 ENCOUNTER — Ambulatory Visit
Admission: RE | Admit: 2017-01-18 | Discharge: 2017-01-18 | Disposition: A | Discharge status: Home / Self Care | Payer: PPO | Source: Ambulatory Visit | Attending: Family Medicine | Admitting: Family Medicine

## 2017-01-18 DIAGNOSIS — M5416 Radiculopathy, lumbar region: Secondary | ICD-10-CM | POA: Diagnosis present

## 2017-01-18 DIAGNOSIS — M48061 Spinal stenosis, lumbar region without neurogenic claudication: Secondary | ICD-10-CM | POA: Diagnosis not present

## 2017-01-18 DIAGNOSIS — M5116 Intervertebral disc disorders with radiculopathy, lumbar region: Secondary | ICD-10-CM | POA: Diagnosis not present

## 2017-02-08 DIAGNOSIS — M5136 Other intervertebral disc degeneration, lumbar region: Secondary | ICD-10-CM | POA: Diagnosis not present

## 2017-02-08 DIAGNOSIS — M5416 Radiculopathy, lumbar region: Secondary | ICD-10-CM | POA: Diagnosis not present

## 2017-02-08 DIAGNOSIS — M48062 Spinal stenosis, lumbar region with neurogenic claudication: Secondary | ICD-10-CM | POA: Diagnosis not present

## 2017-02-21 DIAGNOSIS — H353211 Exudative age-related macular degeneration, right eye, with active choroidal neovascularization: Secondary | ICD-10-CM | POA: Diagnosis not present

## 2017-04-04 DIAGNOSIS — H353211 Exudative age-related macular degeneration, right eye, with active choroidal neovascularization: Secondary | ICD-10-CM | POA: Diagnosis not present

## 2017-04-11 DIAGNOSIS — M5136 Other intervertebral disc degeneration, lumbar region: Secondary | ICD-10-CM | POA: Diagnosis not present

## 2017-04-11 DIAGNOSIS — M48062 Spinal stenosis, lumbar region with neurogenic claudication: Secondary | ICD-10-CM | POA: Diagnosis not present

## 2017-04-11 DIAGNOSIS — M5416 Radiculopathy, lumbar region: Secondary | ICD-10-CM | POA: Diagnosis not present

## 2017-04-11 DIAGNOSIS — M7062 Trochanteric bursitis, left hip: Secondary | ICD-10-CM | POA: Diagnosis not present

## 2017-04-14 DIAGNOSIS — L812 Freckles: Secondary | ICD-10-CM | POA: Diagnosis not present

## 2017-04-14 DIAGNOSIS — L820 Inflamed seborrheic keratosis: Secondary | ICD-10-CM | POA: Diagnosis not present

## 2017-04-25 DIAGNOSIS — I700 Atherosclerosis of aorta: Secondary | ICD-10-CM | POA: Diagnosis not present

## 2017-04-25 DIAGNOSIS — R7309 Other abnormal glucose: Secondary | ICD-10-CM | POA: Diagnosis not present

## 2017-04-25 DIAGNOSIS — E782 Mixed hyperlipidemia: Secondary | ICD-10-CM | POA: Diagnosis not present

## 2017-05-02 DIAGNOSIS — F319 Bipolar disorder, unspecified: Secondary | ICD-10-CM | POA: Diagnosis not present

## 2017-05-02 DIAGNOSIS — I779 Disorder of arteries and arterioles, unspecified: Secondary | ICD-10-CM | POA: Diagnosis not present

## 2017-05-02 DIAGNOSIS — N3941 Urge incontinence: Secondary | ICD-10-CM | POA: Diagnosis not present

## 2017-05-02 DIAGNOSIS — R739 Hyperglycemia, unspecified: Secondary | ICD-10-CM | POA: Diagnosis not present

## 2017-05-02 DIAGNOSIS — I2510 Atherosclerotic heart disease of native coronary artery without angina pectoris: Secondary | ICD-10-CM | POA: Diagnosis not present

## 2017-05-02 DIAGNOSIS — N183 Chronic kidney disease, stage 3 (moderate): Secondary | ICD-10-CM | POA: Diagnosis not present

## 2017-05-02 DIAGNOSIS — I700 Atherosclerosis of aorta: Secondary | ICD-10-CM | POA: Diagnosis not present

## 2017-05-30 DIAGNOSIS — H353211 Exudative age-related macular degeneration, right eye, with active choroidal neovascularization: Secondary | ICD-10-CM | POA: Diagnosis not present

## 2017-06-14 DIAGNOSIS — H903 Sensorineural hearing loss, bilateral: Secondary | ICD-10-CM | POA: Diagnosis not present

## 2017-06-28 DIAGNOSIS — I447 Left bundle-branch block, unspecified: Secondary | ICD-10-CM | POA: Diagnosis not present

## 2017-06-28 DIAGNOSIS — R9431 Abnormal electrocardiogram [ECG] [EKG]: Secondary | ICD-10-CM | POA: Diagnosis not present

## 2017-06-28 DIAGNOSIS — I25118 Atherosclerotic heart disease of native coronary artery with other forms of angina pectoris: Secondary | ICD-10-CM | POA: Diagnosis not present

## 2017-06-28 DIAGNOSIS — E782 Mixed hyperlipidemia: Secondary | ICD-10-CM | POA: Diagnosis not present

## 2017-06-28 DIAGNOSIS — R079 Chest pain, unspecified: Secondary | ICD-10-CM | POA: Diagnosis not present

## 2017-07-19 DIAGNOSIS — I25118 Atherosclerotic heart disease of native coronary artery with other forms of angina pectoris: Secondary | ICD-10-CM | POA: Diagnosis not present

## 2017-07-24 DIAGNOSIS — E782 Mixed hyperlipidemia: Secondary | ICD-10-CM | POA: Diagnosis not present

## 2017-07-24 DIAGNOSIS — I447 Left bundle-branch block, unspecified: Secondary | ICD-10-CM | POA: Diagnosis not present

## 2017-07-24 DIAGNOSIS — I2510 Atherosclerotic heart disease of native coronary artery without angina pectoris: Secondary | ICD-10-CM | POA: Diagnosis not present

## 2017-07-24 DIAGNOSIS — I700 Atherosclerosis of aorta: Secondary | ICD-10-CM | POA: Diagnosis not present

## 2017-07-25 DIAGNOSIS — H353211 Exudative age-related macular degeneration, right eye, with active choroidal neovascularization: Secondary | ICD-10-CM | POA: Diagnosis not present

## 2017-07-28 DIAGNOSIS — M5416 Radiculopathy, lumbar region: Secondary | ICD-10-CM | POA: Diagnosis not present

## 2017-07-28 DIAGNOSIS — M5136 Other intervertebral disc degeneration, lumbar region: Secondary | ICD-10-CM | POA: Diagnosis not present

## 2017-08-04 DIAGNOSIS — M48062 Spinal stenosis, lumbar region with neurogenic claudication: Secondary | ICD-10-CM | POA: Diagnosis not present

## 2017-08-04 DIAGNOSIS — M5416 Radiculopathy, lumbar region: Secondary | ICD-10-CM | POA: Diagnosis not present

## 2017-08-04 DIAGNOSIS — M5136 Other intervertebral disc degeneration, lumbar region: Secondary | ICD-10-CM | POA: Diagnosis not present

## 2017-09-27 DIAGNOSIS — H353211 Exudative age-related macular degeneration, right eye, with active choroidal neovascularization: Secondary | ICD-10-CM | POA: Diagnosis not present

## 2017-10-25 DIAGNOSIS — I2510 Atherosclerotic heart disease of native coronary artery without angina pectoris: Secondary | ICD-10-CM | POA: Diagnosis not present

## 2017-10-25 DIAGNOSIS — N183 Chronic kidney disease, stage 3 (moderate): Secondary | ICD-10-CM | POA: Diagnosis not present

## 2017-10-25 DIAGNOSIS — R739 Hyperglycemia, unspecified: Secondary | ICD-10-CM | POA: Diagnosis not present

## 2017-10-27 DIAGNOSIS — M48062 Spinal stenosis, lumbar region with neurogenic claudication: Secondary | ICD-10-CM | POA: Diagnosis not present

## 2017-10-27 DIAGNOSIS — M542 Cervicalgia: Secondary | ICD-10-CM | POA: Diagnosis not present

## 2017-10-27 DIAGNOSIS — M5416 Radiculopathy, lumbar region: Secondary | ICD-10-CM | POA: Diagnosis not present

## 2017-10-27 DIAGNOSIS — M5136 Other intervertebral disc degeneration, lumbar region: Secondary | ICD-10-CM | POA: Diagnosis not present

## 2017-11-01 DIAGNOSIS — N183 Chronic kidney disease, stage 3 (moderate): Secondary | ICD-10-CM | POA: Diagnosis not present

## 2017-11-01 DIAGNOSIS — I700 Atherosclerosis of aorta: Secondary | ICD-10-CM | POA: Diagnosis not present

## 2017-11-01 DIAGNOSIS — I739 Peripheral vascular disease, unspecified: Secondary | ICD-10-CM | POA: Diagnosis not present

## 2017-11-01 DIAGNOSIS — F319 Bipolar disorder, unspecified: Secondary | ICD-10-CM | POA: Diagnosis not present

## 2017-11-01 DIAGNOSIS — R7309 Other abnormal glucose: Secondary | ICD-10-CM | POA: Diagnosis not present

## 2017-11-01 DIAGNOSIS — I2510 Atherosclerotic heart disease of native coronary artery without angina pectoris: Secondary | ICD-10-CM | POA: Diagnosis not present

## 2017-11-01 DIAGNOSIS — Z0000 Encounter for general adult medical examination without abnormal findings: Secondary | ICD-10-CM | POA: Diagnosis not present

## 2017-11-02 ENCOUNTER — Other Ambulatory Visit: Payer: Self-pay | Admitting: Internal Medicine

## 2017-11-02 DIAGNOSIS — Z1231 Encounter for screening mammogram for malignant neoplasm of breast: Secondary | ICD-10-CM

## 2017-11-15 DIAGNOSIS — H6980 Other specified disorders of Eustachian tube, unspecified ear: Secondary | ICD-10-CM | POA: Diagnosis not present

## 2017-11-20 DIAGNOSIS — K219 Gastro-esophageal reflux disease without esophagitis: Secondary | ICD-10-CM | POA: Diagnosis not present

## 2017-11-20 DIAGNOSIS — F419 Anxiety disorder, unspecified: Secondary | ICD-10-CM | POA: Diagnosis not present

## 2017-11-21 DIAGNOSIS — I10 Essential (primary) hypertension: Secondary | ICD-10-CM | POA: Diagnosis not present

## 2017-11-21 DIAGNOSIS — I5022 Chronic systolic (congestive) heart failure: Secondary | ICD-10-CM | POA: Diagnosis not present

## 2017-11-21 DIAGNOSIS — I700 Atherosclerosis of aorta: Secondary | ICD-10-CM | POA: Diagnosis not present

## 2017-11-21 DIAGNOSIS — I2510 Atherosclerotic heart disease of native coronary artery without angina pectoris: Secondary | ICD-10-CM | POA: Diagnosis not present

## 2017-12-07 DIAGNOSIS — H353211 Exudative age-related macular degeneration, right eye, with active choroidal neovascularization: Secondary | ICD-10-CM | POA: Diagnosis not present

## 2017-12-29 ENCOUNTER — Ambulatory Visit
Admission: RE | Admit: 2017-12-29 | Discharge: 2017-12-29 | Disposition: A | Discharge status: Home / Self Care | Payer: PPO | Source: Ambulatory Visit | Attending: Internal Medicine | Admitting: Internal Medicine

## 2017-12-29 DIAGNOSIS — Z1231 Encounter for screening mammogram for malignant neoplasm of breast: Secondary | ICD-10-CM | POA: Diagnosis not present

## 2018-01-05 DIAGNOSIS — N3000 Acute cystitis without hematuria: Secondary | ICD-10-CM | POA: Diagnosis not present

## 2018-01-17 DIAGNOSIS — M5136 Other intervertebral disc degeneration, lumbar region: Secondary | ICD-10-CM | POA: Diagnosis not present

## 2018-01-17 DIAGNOSIS — M48062 Spinal stenosis, lumbar region with neurogenic claudication: Secondary | ICD-10-CM | POA: Diagnosis not present

## 2018-01-17 DIAGNOSIS — M5416 Radiculopathy, lumbar region: Secondary | ICD-10-CM | POA: Diagnosis not present

## 2018-01-29 DIAGNOSIS — M48062 Spinal stenosis, lumbar region with neurogenic claudication: Secondary | ICD-10-CM | POA: Diagnosis not present

## 2018-01-29 DIAGNOSIS — M7061 Trochanteric bursitis, right hip: Secondary | ICD-10-CM | POA: Diagnosis not present

## 2018-01-29 DIAGNOSIS — M5416 Radiculopathy, lumbar region: Secondary | ICD-10-CM | POA: Diagnosis not present

## 2018-01-29 DIAGNOSIS — M7062 Trochanteric bursitis, left hip: Secondary | ICD-10-CM | POA: Diagnosis not present

## 2018-01-29 DIAGNOSIS — M5136 Other intervertebral disc degeneration, lumbar region: Secondary | ICD-10-CM | POA: Diagnosis not present

## 2018-01-29 DIAGNOSIS — M542 Cervicalgia: Secondary | ICD-10-CM | POA: Diagnosis not present

## 2018-02-22 DIAGNOSIS — H353211 Exudative age-related macular degeneration, right eye, with active choroidal neovascularization: Secondary | ICD-10-CM | POA: Diagnosis not present

## 2018-03-12 DIAGNOSIS — L739 Follicular disorder, unspecified: Secondary | ICD-10-CM | POA: Diagnosis not present

## 2018-03-12 DIAGNOSIS — L820 Inflamed seborrheic keratosis: Secondary | ICD-10-CM | POA: Diagnosis not present

## 2018-03-12 DIAGNOSIS — D2262 Melanocytic nevi of left upper limb, including shoulder: Secondary | ICD-10-CM | POA: Diagnosis not present

## 2018-03-12 DIAGNOSIS — Z86018 Personal history of other benign neoplasm: Secondary | ICD-10-CM

## 2018-03-12 DIAGNOSIS — D225 Melanocytic nevi of trunk: Secondary | ICD-10-CM | POA: Diagnosis not present

## 2018-03-12 DIAGNOSIS — D485 Neoplasm of uncertain behavior of skin: Secondary | ICD-10-CM | POA: Diagnosis not present

## 2018-03-12 HISTORY — DX: Personal history of other benign neoplasm: Z86.018

## 2018-04-09 DIAGNOSIS — D2262 Melanocytic nevi of left upper limb, including shoulder: Secondary | ICD-10-CM | POA: Diagnosis not present

## 2018-04-09 DIAGNOSIS — L988 Other specified disorders of the skin and subcutaneous tissue: Secondary | ICD-10-CM | POA: Diagnosis not present

## 2018-04-18 DIAGNOSIS — D485 Neoplasm of uncertain behavior of skin: Secondary | ICD-10-CM | POA: Diagnosis not present

## 2018-04-18 DIAGNOSIS — D2262 Melanocytic nevi of left upper limb, including shoulder: Secondary | ICD-10-CM | POA: Diagnosis not present

## 2018-04-19 ENCOUNTER — Other Ambulatory Visit: Payer: Self-pay

## 2018-04-19 ENCOUNTER — Emergency Department
Admission: EM | Admit: 2018-04-19 | Discharge: 2018-04-19 | Disposition: A | Discharge status: Home / Self Care | Payer: PPO | Attending: Emergency Medicine | Admitting: Emergency Medicine

## 2018-04-19 ENCOUNTER — Encounter: Payer: Self-pay | Admitting: Emergency Medicine

## 2018-04-19 ENCOUNTER — Emergency Department: Payer: PPO

## 2018-04-19 DIAGNOSIS — Z7982 Long term (current) use of aspirin: Secondary | ICD-10-CM | POA: Insufficient documentation

## 2018-04-19 DIAGNOSIS — I739 Peripheral vascular disease, unspecified: Secondary | ICD-10-CM | POA: Diagnosis not present

## 2018-04-19 DIAGNOSIS — I2510 Atherosclerotic heart disease of native coronary artery without angina pectoris: Secondary | ICD-10-CM | POA: Diagnosis not present

## 2018-04-19 DIAGNOSIS — N183 Chronic kidney disease, stage 3 (moderate): Secondary | ICD-10-CM | POA: Diagnosis not present

## 2018-04-19 DIAGNOSIS — Z87891 Personal history of nicotine dependence: Secondary | ICD-10-CM | POA: Insufficient documentation

## 2018-04-19 DIAGNOSIS — K5900 Constipation, unspecified: Secondary | ICD-10-CM | POA: Insufficient documentation

## 2018-04-19 DIAGNOSIS — Z79899 Other long term (current) drug therapy: Secondary | ICD-10-CM | POA: Insufficient documentation

## 2018-04-19 DIAGNOSIS — R739 Hyperglycemia, unspecified: Secondary | ICD-10-CM | POA: Diagnosis not present

## 2018-04-19 LAB — CBC WITH DIFFERENTIAL/PLATELET
Abs Immature Granulocytes: 0.02 10*3/uL (ref 0.00–0.07)
Basophils Absolute: 0.1 10*3/uL (ref 0.0–0.1)
Basophils Relative: 1 %
Eosinophils Absolute: 0.2 10*3/uL (ref 0.0–0.5)
Eosinophils Relative: 2 %
HCT: 38.3 % (ref 36.0–46.0)
Hemoglobin: 12.6 g/dL (ref 12.0–15.0)
Immature Granulocytes: 0 %
Lymphocytes Relative: 30 %
Lymphs Abs: 2.6 10*3/uL (ref 0.7–4.0)
MCH: 31 pg (ref 26.0–34.0)
MCHC: 32.9 g/dL (ref 30.0–36.0)
MCV: 94.3 fL (ref 80.0–100.0)
Monocytes Absolute: 0.7 10*3/uL (ref 0.1–1.0)
Monocytes Relative: 8 %
Neutro Abs: 5.2 10*3/uL (ref 1.7–7.7)
Neutrophils Relative %: 59 %
Platelets: 247 10*3/uL (ref 150–400)
RBC: 4.06 MIL/uL (ref 3.87–5.11)
RDW: 13.5 % (ref 11.5–15.5)
WBC: 8.8 10*3/uL (ref 4.0–10.5)
nRBC: 0 % (ref 0.0–0.2)

## 2018-04-19 LAB — COMPREHENSIVE METABOLIC PANEL
ALT: 14 U/L (ref 0–44)
AST: 22 U/L (ref 15–41)
Albumin: 3.8 g/dL (ref 3.5–5.0)
Alkaline Phosphatase: 60 U/L (ref 38–126)
Anion gap: 7 (ref 5–15)
BUN: 24 mg/dL — ABNORMAL HIGH (ref 8–23)
CO2: 22 mmol/L (ref 22–32)
Calcium: 9.4 mg/dL (ref 8.9–10.3)
Chloride: 108 mmol/L (ref 98–111)
Creatinine, Ser: 1.14 mg/dL — ABNORMAL HIGH (ref 0.44–1.00)
GFR calc Af Amer: 55 mL/min — ABNORMAL LOW (ref >60)
GFR calc non Af Amer: 47 mL/min — ABNORMAL LOW (ref >60)
Glucose, Bld: 111 mg/dL — ABNORMAL HIGH (ref 70–99)
Potassium: 4.1 mmol/L (ref 3.5–5.1)
Sodium: 137 mmol/L (ref 135–145)
Total Bilirubin: 0.4 mg/dL (ref 0.3–1.2)
Total Protein: 6.9 g/dL (ref 6.5–8.1)

## 2018-04-19 MED ORDER — POLYETHYLENE GLYCOL 3350 17 G PO PACK: 17.0000 g | PACK | Freq: Every day | ORAL | 0 refills | Status: DC

## 2018-04-19 MED ORDER — DOCUSATE SODIUM 100 MG PO CAPS: 100.0000 mg | ORAL_CAPSULE | Freq: Every day | ORAL | 2 refills | Status: AC | PRN

## 2018-04-19 NOTE — ED Triage Notes (Signed)
PT arrives with concerns over recurrent problems of constipation and needing to manual disimpact herself. Pt reports for the last 3 days she has had to manually disimpact herself. PT reports she was successful at removing stool today but still remains concerned.

## 2018-04-19 NOTE — ED Notes (Signed)
Pt assisted to bedside commode at this time. Pt with steady gait. Pt assisted back to bed by this RN.

## 2018-04-19 NOTE — ED Provider Notes (Addendum)
[facility] Emergency Department Provider Note  ____________________________________________   I have reviewed the triage vital signs and the nursing notes. Where available I have reviewed prior notes and, if possible and indicated, outside hospital notes.    HISTORY  Chief Complaint Fecal Impaction    HPI Madeline Cabrera is a 73 y.o. female who presents today complaining of constipation.  Patient states his been constipated for the last week or so.  She has been manual disimpaction's, and she believes she is taking care of the problem however, she wants to know why she has been constipated.  She is not had any fever vomiting or abdominal pain.  She denies any change in medication that would cause constipation.  However patient is on chronic narcotic pain medications.  She has been constipated in the past her narcotics but not to this extent.  She states she had a trace of blood on her fingers after she manually disimpact herself.  Otherwise no bleeding no melena no bright red blood per rectum no hematemesis no other complaints aside from constipation which she believes to be resolved at this time.   Past Medical History:  Diagnosis Date  . Anxiety   . Arthritis   . Coronary artery disease   . GERD (gastroesophageal reflux disease)   . Headache(784.0)   . Hyperlipidemia   . PONV (postoperative nausea and vomiting)   . Renal insufficiency     There are no active problems to display for this patient.   Past Surgical History:  Procedure Laterality Date  . ABDOMINAL HYSTERECTOMY    . ARTHRODESIS METATARSALPHALANGEAL JOINT (MTPJ) Left 01/09/2015   Procedure: ARTHRODESIS METATARSALPHALANGEAL JOINT (MTPJ);  Surgeon: Albertine Kyira, DPM;  Location: [facility] ORS;  Service: Podiatry;  Laterality: Left;  . CARDIAC CATHETERIZATION    . CATARACT EXTRACTION Bilateral   . CORONARY ANGIOPLASTY WITH STENT PLACEMENT    . heel spurs Right   . TRIGGER FINGER RELEASE  Bilateral     Prior to Admission medications   Medication Sig Start Date End Date Taking? Authorizing Provider  aspirin 81 MG tablet Take 81 mg by mouth daily.    [provider]  cholecalciferol (VITAMIN D) 1000 UNITS tablet Take 1,000 Units by mouth daily.    [provider]  citalopram (CELEXA) 20 MG tablet Take 40 mg by mouth daily.     [provider]  cyclobenzaprine (FLEXERIL) 5 MG tablet Take 1 tablet (5 mg total) by mouth 3 (three) times daily as needed for muscle spasms. 10/02/15   Menshew, Dannielle Karvonen, PA-C  DiphenhydrAMINE HCl (ALLERGY MEDICATION PO) Take 1 tablet by mouth every evening.     [provider]  docusate sodium (COLACE) 250 MG capsule Take 250 mg by mouth at bedtime.    [provider]  estrogens, conjugated, (PREMARIN) 1.25 MG tablet Take 1.25 mg by mouth every 7 (seven) days. Reported on 12/24/2014    [provider]  etodolac (LODINE) 200 MG capsule Take 1 capsule (200 mg total) by mouth every 8 (eight) hours. 06/09/16   Loney Hering, MD  Hypromellose (GENTEAL OP) Apply 2 drops to eye 2 (two) times daily.    [provider]  lovastatin (MEVACOR) 40 MG tablet Take 40 mg by mouth at bedtime.    [provider]  omeprazole (PRILOSEC) 40 MG capsule Take 40 mg by mouth 2 (two) times daily.    [provider]  oxyCODONE-acetaminophen (PERCOCET) 7.5-325 MG tablet Take 1 tablet  by mouth every 4 (four) hours as needed for severe pain. 01/09/15   Troxler, Rodman Key, DPM  oxyCODONE-acetaminophen (ROXICET) 5-325 MG tablet Take 1 tablet by mouth every 6 (six) hours as needed. 06/09/16   Loney Hering, MD  predniSONE (DELTASONE) 10 MG tablet Take 1 tablet (10 mg total) by mouth 2 (two) times daily with a meal. 10/02/15   Menshew, Dannielle Karvonen, PA-C  scopolamine (TRANSDERM-SCOP) 1 MG/3DAYS Place 1 patch (1.5 mg total) onto the skin every 3 (three) days. 01/09/15   Troxler, Rodman Key, DPM  traMADol  (ULTRAM) 50 MG tablet Take by mouth 2 (two) times daily as needed.    [provider]  White Petrolatum-Mineral Oil (GENTEAL PM) 85-15 % OINT Apply 1 application to eye every evening.    [provider]    Allergies Clindamycin/lincomycin; Doxycycline; Hydrocodone-guaifenesin; Minocycline; Naprosyn [naproxen]; Biaxin [clarithromycin]; and Penicillins  No family history on file.  Social History Social History   Tobacco Use  . Smoking status: Former Smoker    Packs/day: 0.25    Years: 2.00    Pack years: 0.50    Last attempt to quit: 01/03/2002    Years since quitting: 16.3  . Smokeless tobacco: Never Used  Substance Use Topics  . Alcohol use: No  . Drug use: No    Review of Systems Constitutional: No fever/chills Eyes: No visual changes. ENT: No sore throat. No stiff neck no neck pain Cardiovascular: Denies chest pain. Respiratory: Denies shortness of breath. Gastrointestinal:   no vomiting.  No diarrhea.  + constipation. Genitourinary: Negative for dysuria. Musculoskeletal: Negative lower extremity swelling Skin: Negative for rash. Neurological: Negative for severe headaches, focal weakness or numbness.   ____________________________________________   PHYSICAL EXAM:  VITAL SIGNS: ED Triage Vitals  Enc Vitals Group     BP 04/19/18 1505 (!) 158/73     Pulse Rate 04/19/18 1505 75     Resp 04/19/18 1505 18     Temp 04/19/18 1505 98.2 F (36.8 C)     Temp src --      SpO2 04/19/18 1505 99 %     Weight 04/19/18 1507 152 lb (68.9 kg)     Height 04/19/18 1507 5\' 2"  (1.575 m)     Head Circumference --      Peak Flow --      Pain Score 04/19/18 1507 2     Pain Loc --      Pain Edu? --      Excl. in [place]? --     Constitutional: Alert and oriented. Well appearing and in no acute distress. Eyes: Conjunctivae are normal Head: Atraumatic HEENT: No congestion/rhinnorhea. Mucous membranes are moist.  Oropharynx non-erythematous Neck:   Nontender with no  meningismus, no masses, no stridor Cardiovascular: Normal rate, regular rhythm. Grossly normal heart sounds.  Good peripheral circulation. Respiratory: Normal respiratory effort.  No retractions. Lungs CTAB. Abdominal: Soft and nontender. No distention. No guarding no rebound Back:  There is no focal tenderness or step off.  there is no midline tenderness there are no lesions noted. there is no CVA tenderness  Musculoskeletal: No lower extremity tenderness, no upper extremity tenderness. No joint effusions, no DVT signs strong distal pulses no edema Neurologic:  Normal speech and language. No gross focal neurologic deficits are appreciated.  Skin:  Skin is warm, dry and intact. No rash noted. Psychiatric: Mood and affect are normal. Speech and behavior are normal.  ____________________________________________   LABS (all labs ordered are listed, but  only abnormal results are displayed)  Labs Reviewed  CBC WITH DIFFERENTIAL/PLATELET  COMPREHENSIVE METABOLIC PANEL    Pertinent labs  results that were available during my care of the patient were reviewed by me and considered in my medical decision making (see chart for details). ____________________________________________  EKG  I personally interpreted any EKGs ordered by me or triage  ____________________________________________  RADIOLOGY  Pertinent labs & imaging results that were available during my care of the patient were reviewed by me and considered in my medical decision making (see chart for details). If possible, patient and/or family made aware of any abnormal findings.  Dg Abdomen Acute W/chest  Result Date: 04/19/2018 CLINICAL DATA:  Constipation.  Abdominal pain. EXAM: DG ABDOMEN ACUTE W/ 1V CHEST COMPARISON:  None. FINDINGS: Moderate amount of fecal matter within the colon, upper limits of normal but not extraordinary. Small bowel pattern is normal. No significant soft tissue calcifications or bone findings. Previous  PLIF L5-S1. One-view chest shows normal heart and mediastinal shadows except for some aortic atherosclerotic calcification. There is emphysema and pulmonary scarring. No infiltrate, collapse or effusion. Chronic degenerative and or post traumatic changes of the right shoulder. IMPRESSION: Patient has a moderate amount of fecal matter, but this is not outside of the range of normal. Small bowel pattern is normal. Electronically Signed   By: Nelson Chimes M.D.   On: 04/19/2018 16:14   ____________________________________________    PROCEDURES  Procedure(s) performed: None  Procedures  Critical Care performed: None  ____________________________________________   INITIAL IMPRESSION / ASSESSMENT AND PLAN / ED COURSE  Pertinent labs & imaging results that were available during my care of the patient were reviewed by me and considered in my medical decision making (see chart for details).  Patient very well-appearing abdomen completely benign because of her age and reported bleeding after self disimpaction we will obtain basic blood work, I have offered her an enema as I do see continued colonic stool burden, and I think she will need oral medications given her narcotics.  She is also to talk to the person prescribing her narcotics to see if they can do something to help her with specific medications that are used for that.  However, in the emergency room we will try to address the acute issue.  ----------------------------------------- 5:49 PM on 04/19/2018 -----------------------------------------  Exam: Female nurse chaperone present, there is no stool in the vault.  No evidence of rectal trauma or rectal bleeding.  Guaiac negative.  I have offered the patient an enema, and she states it because she does not want to try to pass "hard balls" again at home, she would like to try an enema.  This is likely narcotic related constipation  ----------------------------------------- 5:50 PM on  04/19/2018 -----------------------------------------  Patient had a very large and satisfying bowel movement, no bleeding, feels "great" wants to go home.  Abdomen is benign.  I do not think CT scan is indicated considering the patient's symptoms, medical history, and physical examination today, I have low suspicion for cholecystitis or biliary pathology, pancreatitis, perforation or bowel obstruction, hernia, intra-abdominal abscess, AAA or dissection, volvulus or intussusception, mesenteric ischemia, ischemic gut, pyelonephritis or appendicitis.   ____________________________________________   FINAL CLINICAL IMPRESSION(S) / ED DIAGNOSES  Final diagnoses:  None      This chart was dictated using voice recognition software.  Despite best efforts to proofread,  errors can occur which can change meaning.      Schuyler Amor, MD 04/19/18 [phone]  Schuyler Amor, MD 04/19/18 1750    Schuyler Amor, MD 04/19/18 [phone]

## 2018-04-19 NOTE — ED Notes (Signed)
Pt refusing to keep blood pressure cuff on stating "It's squeezing too hard to keep on". MD aware.

## 2018-04-24 DIAGNOSIS — I10 Essential (primary) hypertension: Secondary | ICD-10-CM | POA: Diagnosis not present

## 2018-04-24 DIAGNOSIS — I739 Peripheral vascular disease, unspecified: Secondary | ICD-10-CM | POA: Diagnosis not present

## 2018-04-24 DIAGNOSIS — R413 Other amnesia: Secondary | ICD-10-CM | POA: Diagnosis not present

## 2018-04-24 DIAGNOSIS — I5022 Chronic systolic (congestive) heart failure: Secondary | ICD-10-CM | POA: Diagnosis not present

## 2018-04-24 DIAGNOSIS — R7309 Other abnormal glucose: Secondary | ICD-10-CM | POA: Diagnosis not present

## 2018-04-24 DIAGNOSIS — I2510 Atherosclerotic heart disease of native coronary artery without angina pectoris: Secondary | ICD-10-CM | POA: Diagnosis not present

## 2018-04-24 DIAGNOSIS — I700 Atherosclerosis of aorta: Secondary | ICD-10-CM | POA: Diagnosis not present

## 2018-04-24 DIAGNOSIS — F319 Bipolar disorder, unspecified: Secondary | ICD-10-CM | POA: Diagnosis not present

## 2018-04-24 DIAGNOSIS — N183 Chronic kidney disease, stage 3 (moderate): Secondary | ICD-10-CM | POA: Diagnosis not present

## 2018-04-30 DIAGNOSIS — M48062 Spinal stenosis, lumbar region with neurogenic claudication: Secondary | ICD-10-CM | POA: Diagnosis not present

## 2018-04-30 DIAGNOSIS — M5136 Other intervertebral disc degeneration, lumbar region: Secondary | ICD-10-CM | POA: Diagnosis not present

## 2018-04-30 DIAGNOSIS — M5416 Radiculopathy, lumbar region: Secondary | ICD-10-CM | POA: Diagnosis not present

## 2018-05-09 DIAGNOSIS — R270 Ataxia, unspecified: Secondary | ICD-10-CM | POA: Diagnosis not present

## 2018-05-09 DIAGNOSIS — I779 Disorder of arteries and arterioles, unspecified: Secondary | ICD-10-CM | POA: Diagnosis not present

## 2018-05-09 DIAGNOSIS — R7309 Other abnormal glucose: Secondary | ICD-10-CM | POA: Diagnosis not present

## 2018-05-09 DIAGNOSIS — Z9989 Dependence on other enabling machines and devices: Secondary | ICD-10-CM | POA: Diagnosis not present

## 2018-05-09 DIAGNOSIS — N183 Chronic kidney disease, stage 3 (moderate): Secondary | ICD-10-CM | POA: Diagnosis not present

## 2018-05-09 DIAGNOSIS — F319 Bipolar disorder, unspecified: Secondary | ICD-10-CM | POA: Diagnosis not present

## 2018-05-09 DIAGNOSIS — I5022 Chronic systolic (congestive) heart failure: Secondary | ICD-10-CM | POA: Diagnosis not present

## 2018-05-11 ENCOUNTER — Other Ambulatory Visit: Payer: Self-pay | Admitting: Internal Medicine

## 2018-05-11 DIAGNOSIS — R270 Ataxia, unspecified: Secondary | ICD-10-CM

## 2018-05-15 DIAGNOSIS — D225 Melanocytic nevi of trunk: Secondary | ICD-10-CM | POA: Diagnosis not present

## 2018-05-15 DIAGNOSIS — D485 Neoplasm of uncertain behavior of skin: Secondary | ICD-10-CM | POA: Diagnosis not present

## 2018-05-15 DIAGNOSIS — C44319 Basal cell carcinoma of skin of other parts of face: Secondary | ICD-10-CM | POA: Diagnosis not present

## 2018-05-15 DIAGNOSIS — Z85828 Personal history of other malignant neoplasm of skin: Secondary | ICD-10-CM

## 2018-05-15 DIAGNOSIS — C4401 Basal cell carcinoma of skin of lip: Secondary | ICD-10-CM | POA: Diagnosis not present

## 2018-05-15 HISTORY — DX: Personal history of other malignant neoplasm of skin: Z85.828

## 2018-05-17 DIAGNOSIS — H353211 Exudative age-related macular degeneration, right eye, with active choroidal neovascularization: Secondary | ICD-10-CM | POA: Diagnosis not present

## 2018-05-30 ENCOUNTER — Ambulatory Visit
Admission: RE | Admit: 2018-05-30 | Discharge: 2018-05-30 | Disposition: A | Discharge status: Home / Self Care | Payer: PPO | Source: Ambulatory Visit | Attending: Internal Medicine | Admitting: Internal Medicine

## 2018-05-30 ENCOUNTER — Other Ambulatory Visit: Payer: Self-pay

## 2018-05-30 DIAGNOSIS — R270 Ataxia, unspecified: Secondary | ICD-10-CM | POA: Insufficient documentation

## 2018-05-30 MED ORDER — GADOBUTROL 1 MMOL/ML IV SOLN
7.0000 mL | Freq: Once | INTRAVENOUS | Status: AC | PRN
  Administered 2018-05-30: 7 mL via INTRAVENOUS

## 2018-05-31 DIAGNOSIS — I10 Essential (primary) hypertension: Secondary | ICD-10-CM | POA: Diagnosis not present

## 2018-05-31 DIAGNOSIS — I6322 Cerebral infarction due to unspecified occlusion or stenosis of basilar arteries: Secondary | ICD-10-CM | POA: Diagnosis not present

## 2018-05-31 DIAGNOSIS — I2510 Atherosclerotic heart disease of native coronary artery without angina pectoris: Secondary | ICD-10-CM | POA: Diagnosis not present

## 2018-06-04 DIAGNOSIS — R1084 Generalized abdominal pain: Secondary | ICD-10-CM | POA: Diagnosis not present

## 2018-06-04 DIAGNOSIS — K5900 Constipation, unspecified: Secondary | ICD-10-CM | POA: Diagnosis not present

## 2018-07-30 DIAGNOSIS — M48062 Spinal stenosis, lumbar region with neurogenic claudication: Secondary | ICD-10-CM | POA: Diagnosis not present

## 2018-07-30 DIAGNOSIS — M5136 Other intervertebral disc degeneration, lumbar region: Secondary | ICD-10-CM | POA: Diagnosis not present

## 2018-07-30 DIAGNOSIS — M5416 Radiculopathy, lumbar region: Secondary | ICD-10-CM | POA: Diagnosis not present

## 2018-08-08 DIAGNOSIS — H6123 Impacted cerumen, bilateral: Secondary | ICD-10-CM | POA: Diagnosis not present

## 2018-08-08 DIAGNOSIS — H9319 Tinnitus, unspecified ear: Secondary | ICD-10-CM | POA: Diagnosis not present

## 2018-08-08 DIAGNOSIS — H903 Sensorineural hearing loss, bilateral: Secondary | ICD-10-CM | POA: Diagnosis not present

## 2018-08-09 DIAGNOSIS — H353211 Exudative age-related macular degeneration, right eye, with active choroidal neovascularization: Secondary | ICD-10-CM | POA: Diagnosis not present

## 2018-08-17 DIAGNOSIS — L821 Other seborrheic keratosis: Secondary | ICD-10-CM | POA: Diagnosis not present

## 2018-08-17 DIAGNOSIS — C4401 Basal cell carcinoma of skin of lip: Secondary | ICD-10-CM | POA: Diagnosis not present

## 2018-10-03 DIAGNOSIS — C4401 Basal cell carcinoma of skin of lip: Secondary | ICD-10-CM | POA: Diagnosis not present

## 2018-10-03 DIAGNOSIS — D2239 Melanocytic nevi of other parts of face: Secondary | ICD-10-CM | POA: Diagnosis not present

## 2018-10-03 DIAGNOSIS — C44319 Basal cell carcinoma of skin of other parts of face: Secondary | ICD-10-CM | POA: Diagnosis not present

## 2018-10-09 DIAGNOSIS — Z85828 Personal history of other malignant neoplasm of skin: Secondary | ICD-10-CM | POA: Diagnosis not present

## 2018-10-09 DIAGNOSIS — Z4802 Encounter for removal of sutures: Secondary | ICD-10-CM | POA: Diagnosis not present

## 2018-10-30 DIAGNOSIS — I779 Disorder of arteries and arterioles, unspecified: Secondary | ICD-10-CM | POA: Diagnosis not present

## 2018-10-30 DIAGNOSIS — I5022 Chronic systolic (congestive) heart failure: Secondary | ICD-10-CM | POA: Diagnosis not present

## 2018-10-30 DIAGNOSIS — I2510 Atherosclerotic heart disease of native coronary artery without angina pectoris: Secondary | ICD-10-CM | POA: Diagnosis not present

## 2018-10-30 DIAGNOSIS — R7309 Other abnormal glucose: Secondary | ICD-10-CM | POA: Diagnosis not present

## 2018-11-01 DIAGNOSIS — H353211 Exudative age-related macular degeneration, right eye, with active choroidal neovascularization: Secondary | ICD-10-CM | POA: Diagnosis not present

## 2018-11-20 DIAGNOSIS — K219 Gastro-esophageal reflux disease without esophagitis: Secondary | ICD-10-CM | POA: Diagnosis not present

## 2018-11-20 DIAGNOSIS — I5022 Chronic systolic (congestive) heart failure: Secondary | ICD-10-CM | POA: Diagnosis not present

## 2018-11-20 DIAGNOSIS — K5909 Other constipation: Secondary | ICD-10-CM | POA: Diagnosis not present

## 2018-11-20 DIAGNOSIS — I779 Disorder of arteries and arterioles, unspecified: Secondary | ICD-10-CM | POA: Diagnosis not present

## 2018-11-20 DIAGNOSIS — F319 Bipolar disorder, unspecified: Secondary | ICD-10-CM | POA: Diagnosis not present

## 2018-11-20 DIAGNOSIS — R1310 Dysphagia, unspecified: Secondary | ICD-10-CM | POA: Diagnosis not present

## 2018-11-20 DIAGNOSIS — M5137 Other intervertebral disc degeneration, lumbosacral region: Secondary | ICD-10-CM | POA: Diagnosis not present

## 2018-11-23 ENCOUNTER — Other Ambulatory Visit: Payer: Self-pay | Admitting: Internal Medicine

## 2018-11-23 DIAGNOSIS — Z1231 Encounter for screening mammogram for malignant neoplasm of breast: Secondary | ICD-10-CM

## 2018-11-26 DIAGNOSIS — M48062 Spinal stenosis, lumbar region with neurogenic claudication: Secondary | ICD-10-CM | POA: Diagnosis not present

## 2018-11-26 DIAGNOSIS — M5136 Other intervertebral disc degeneration, lumbar region: Secondary | ICD-10-CM | POA: Diagnosis not present

## 2018-11-26 DIAGNOSIS — M5416 Radiculopathy, lumbar region: Secondary | ICD-10-CM | POA: Diagnosis not present

## 2018-12-03 DIAGNOSIS — K5900 Constipation, unspecified: Secondary | ICD-10-CM | POA: Diagnosis not present

## 2018-12-03 DIAGNOSIS — K5909 Other constipation: Secondary | ICD-10-CM | POA: Diagnosis not present

## 2018-12-05 DIAGNOSIS — I5022 Chronic systolic (congestive) heart failure: Secondary | ICD-10-CM | POA: Diagnosis not present

## 2018-12-05 DIAGNOSIS — I447 Left bundle-branch block, unspecified: Secondary | ICD-10-CM | POA: Diagnosis not present

## 2018-12-05 DIAGNOSIS — I779 Disorder of arteries and arterioles, unspecified: Secondary | ICD-10-CM | POA: Diagnosis not present

## 2018-12-05 DIAGNOSIS — E782 Mixed hyperlipidemia: Secondary | ICD-10-CM | POA: Diagnosis not present

## 2018-12-05 DIAGNOSIS — I10 Essential (primary) hypertension: Secondary | ICD-10-CM | POA: Diagnosis not present

## 2018-12-05 DIAGNOSIS — I2510 Atherosclerotic heart disease of native coronary artery without angina pectoris: Secondary | ICD-10-CM | POA: Diagnosis not present

## 2018-12-19 DIAGNOSIS — H353221 Exudative age-related macular degeneration, left eye, with active choroidal neovascularization: Secondary | ICD-10-CM | POA: Diagnosis not present

## 2018-12-20 DIAGNOSIS — I5022 Chronic systolic (congestive) heart failure: Secondary | ICD-10-CM | POA: Diagnosis not present

## 2018-12-20 DIAGNOSIS — Z86010 Personal history of colonic polyps: Secondary | ICD-10-CM | POA: Diagnosis not present

## 2018-12-20 DIAGNOSIS — K219 Gastro-esophageal reflux disease without esophagitis: Secondary | ICD-10-CM | POA: Diagnosis not present

## 2018-12-20 DIAGNOSIS — F319 Bipolar disorder, unspecified: Secondary | ICD-10-CM | POA: Diagnosis not present

## 2018-12-20 DIAGNOSIS — K5909 Other constipation: Secondary | ICD-10-CM | POA: Diagnosis not present

## 2019-01-01 DIAGNOSIS — I10 Essential (primary) hypertension: Secondary | ICD-10-CM | POA: Diagnosis not present

## 2019-01-03 DIAGNOSIS — F319 Bipolar disorder, unspecified: Secondary | ICD-10-CM | POA: Diagnosis not present

## 2019-01-03 DIAGNOSIS — Z9989 Dependence on other enabling machines and devices: Secondary | ICD-10-CM | POA: Diagnosis not present

## 2019-01-03 DIAGNOSIS — I5022 Chronic systolic (congestive) heart failure: Secondary | ICD-10-CM | POA: Diagnosis not present

## 2019-01-03 DIAGNOSIS — Z0000 Encounter for general adult medical examination without abnormal findings: Secondary | ICD-10-CM | POA: Diagnosis not present

## 2019-01-03 DIAGNOSIS — N1832 Chronic kidney disease, stage 3b: Secondary | ICD-10-CM | POA: Diagnosis not present

## 2019-01-03 DIAGNOSIS — I10 Essential (primary) hypertension: Secondary | ICD-10-CM | POA: Diagnosis not present

## 2019-01-03 DIAGNOSIS — Z03818 Encounter for observation for suspected exposure to other biological agents ruled out: Secondary | ICD-10-CM | POA: Diagnosis not present

## 2019-01-03 DIAGNOSIS — J029 Acute pharyngitis, unspecified: Secondary | ICD-10-CM | POA: Diagnosis not present

## 2019-01-03 DIAGNOSIS — R05 Cough: Secondary | ICD-10-CM | POA: Diagnosis not present

## 2019-01-03 DIAGNOSIS — I2510 Atherosclerotic heart disease of native coronary artery without angina pectoris: Secondary | ICD-10-CM | POA: Diagnosis not present

## 2019-01-03 DIAGNOSIS — I700 Atherosclerosis of aorta: Secondary | ICD-10-CM | POA: Diagnosis not present

## 2019-01-03 DIAGNOSIS — I779 Disorder of arteries and arterioles, unspecified: Secondary | ICD-10-CM | POA: Diagnosis not present

## 2019-01-07 ENCOUNTER — Ambulatory Visit
Admission: RE | Admit: 2019-01-07 | Discharge: 2019-01-07 | Disposition: A | Discharge status: Home / Self Care | Payer: PPO | Source: Ambulatory Visit | Attending: Internal Medicine | Admitting: Internal Medicine

## 2019-01-07 DIAGNOSIS — Z1231 Encounter for screening mammogram for malignant neoplasm of breast: Secondary | ICD-10-CM | POA: Insufficient documentation

## 2019-01-17 DIAGNOSIS — H353231 Exudative age-related macular degeneration, bilateral, with active choroidal neovascularization: Secondary | ICD-10-CM | POA: Diagnosis not present

## 2019-01-31 DIAGNOSIS — Z20822 Contact with and (suspected) exposure to covid-19: Secondary | ICD-10-CM | POA: Diagnosis not present

## 2019-02-25 DIAGNOSIS — M7062 Trochanteric bursitis, left hip: Secondary | ICD-10-CM | POA: Diagnosis not present

## 2019-02-25 DIAGNOSIS — M48062 Spinal stenosis, lumbar region with neurogenic claudication: Secondary | ICD-10-CM | POA: Diagnosis not present

## 2019-02-25 DIAGNOSIS — M5136 Other intervertebral disc degeneration, lumbar region: Secondary | ICD-10-CM | POA: Diagnosis not present

## 2019-02-25 DIAGNOSIS — M542 Cervicalgia: Secondary | ICD-10-CM | POA: Diagnosis not present

## 2019-02-25 DIAGNOSIS — M7061 Trochanteric bursitis, right hip: Secondary | ICD-10-CM | POA: Diagnosis not present

## 2019-02-25 DIAGNOSIS — M5416 Radiculopathy, lumbar region: Secondary | ICD-10-CM | POA: Diagnosis not present

## 2019-02-27 DIAGNOSIS — H353221 Exudative age-related macular degeneration, left eye, with active choroidal neovascularization: Secondary | ICD-10-CM | POA: Diagnosis not present

## 2019-03-01 DIAGNOSIS — M48062 Spinal stenosis, lumbar region with neurogenic claudication: Secondary | ICD-10-CM | POA: Diagnosis not present

## 2019-03-01 DIAGNOSIS — M5416 Radiculopathy, lumbar region: Secondary | ICD-10-CM | POA: Diagnosis not present

## 2019-03-01 DIAGNOSIS — M5136 Other intervertebral disc degeneration, lumbar region: Secondary | ICD-10-CM | POA: Diagnosis not present

## 2019-03-29 DIAGNOSIS — M5136 Other intervertebral disc degeneration, lumbar region: Secondary | ICD-10-CM | POA: Diagnosis not present

## 2019-03-29 DIAGNOSIS — M48062 Spinal stenosis, lumbar region with neurogenic claudication: Secondary | ICD-10-CM | POA: Diagnosis not present

## 2019-03-29 DIAGNOSIS — M5416 Radiculopathy, lumbar region: Secondary | ICD-10-CM | POA: Diagnosis not present

## 2019-04-03 DIAGNOSIS — K219 Gastro-esophageal reflux disease without esophagitis: Secondary | ICD-10-CM | POA: Diagnosis not present

## 2019-04-03 DIAGNOSIS — K5904 Chronic idiopathic constipation: Secondary | ICD-10-CM | POA: Diagnosis not present

## 2019-04-03 DIAGNOSIS — R1310 Dysphagia, unspecified: Secondary | ICD-10-CM | POA: Diagnosis not present

## 2019-04-03 DIAGNOSIS — F319 Bipolar disorder, unspecified: Secondary | ICD-10-CM | POA: Diagnosis not present

## 2019-04-03 DIAGNOSIS — I5022 Chronic systolic (congestive) heart failure: Secondary | ICD-10-CM | POA: Diagnosis not present

## 2019-04-03 DIAGNOSIS — Z86010 Personal history of colonic polyps: Secondary | ICD-10-CM | POA: Diagnosis not present

## 2019-04-08 ENCOUNTER — Other Ambulatory Visit: Payer: Self-pay

## 2019-04-08 ENCOUNTER — Other Ambulatory Visit
Admission: RE | Admit: 2019-04-08 | Discharge: 2019-04-08 | Disposition: A | Discharge status: Home / Self Care | Payer: PPO | Source: Ambulatory Visit | Attending: Internal Medicine | Admitting: Internal Medicine

## 2019-04-08 DIAGNOSIS — I10 Essential (primary) hypertension: Secondary | ICD-10-CM | POA: Diagnosis not present

## 2019-04-08 DIAGNOSIS — E782 Mixed hyperlipidemia: Secondary | ICD-10-CM | POA: Diagnosis not present

## 2019-04-08 DIAGNOSIS — Z01812 Encounter for preprocedural laboratory examination: Secondary | ICD-10-CM | POA: Diagnosis not present

## 2019-04-08 DIAGNOSIS — Z20822 Contact with and (suspected) exposure to covid-19: Secondary | ICD-10-CM | POA: Diagnosis not present

## 2019-04-08 DIAGNOSIS — I2510 Atherosclerotic heart disease of native coronary artery without angina pectoris: Secondary | ICD-10-CM | POA: Diagnosis not present

## 2019-04-08 DIAGNOSIS — I5022 Chronic systolic (congestive) heart failure: Secondary | ICD-10-CM | POA: Diagnosis not present

## 2019-04-08 DIAGNOSIS — I651 Occlusion and stenosis of basilar artery: Secondary | ICD-10-CM | POA: Diagnosis not present

## 2019-04-08 DIAGNOSIS — I700 Atherosclerosis of aorta: Secondary | ICD-10-CM | POA: Diagnosis not present

## 2019-04-09 ENCOUNTER — Encounter: Payer: Self-pay | Admitting: Internal Medicine

## 2019-04-09 LAB — SARS CORONAVIRUS 2 (TAT 6-24 HRS): SARS Coronavirus 2: NEGATIVE

## 2019-04-10 ENCOUNTER — Ambulatory Visit
Admission: RE | Admit: 2019-04-10 | Discharge: 2019-04-10 | Disposition: A | Discharge status: Home / Self Care | Payer: PPO | Attending: Internal Medicine | Admitting: Internal Medicine

## 2019-04-10 ENCOUNTER — Ambulatory Visit: Payer: PPO | Admitting: Anesthesiology

## 2019-04-10 ENCOUNTER — Encounter
Admission: RE | Disposition: A | Discharge status: Home / Self Care | Payer: Self-pay | Source: Home / Self Care | Attending: Internal Medicine

## 2019-04-10 ENCOUNTER — Other Ambulatory Visit: Payer: Self-pay

## 2019-04-10 ENCOUNTER — Encounter: Payer: Self-pay | Admitting: Internal Medicine

## 2019-04-10 DIAGNOSIS — Z7952 Long term (current) use of systemic steroids: Secondary | ICD-10-CM | POA: Diagnosis not present

## 2019-04-10 DIAGNOSIS — R1314 Dysphagia, pharyngoesophageal phase: Secondary | ICD-10-CM | POA: Insufficient documentation

## 2019-04-10 DIAGNOSIS — Z886 Allergy status to analgesic agent status: Secondary | ICD-10-CM | POA: Insufficient documentation

## 2019-04-10 DIAGNOSIS — E785 Hyperlipidemia, unspecified: Secondary | ICD-10-CM | POA: Diagnosis not present

## 2019-04-10 DIAGNOSIS — Z8673 Personal history of transient ischemic attack (TIA), and cerebral infarction without residual deficits: Secondary | ICD-10-CM | POA: Diagnosis not present

## 2019-04-10 DIAGNOSIS — Z86010 Personal history of colonic polyps: Secondary | ICD-10-CM | POA: Insufficient documentation

## 2019-04-10 DIAGNOSIS — Q438 Other specified congenital malformations of intestine: Secondary | ICD-10-CM | POA: Diagnosis not present

## 2019-04-10 DIAGNOSIS — K219 Gastro-esophageal reflux disease without esophagitis: Secondary | ICD-10-CM | POA: Insufficient documentation

## 2019-04-10 DIAGNOSIS — Z885 Allergy status to narcotic agent status: Secondary | ICD-10-CM | POA: Diagnosis not present

## 2019-04-10 DIAGNOSIS — Z7689 Persons encountering health services in other specified circumstances: Secondary | ICD-10-CM | POA: Diagnosis not present

## 2019-04-10 DIAGNOSIS — Z1211 Encounter for screening for malignant neoplasm of colon: Secondary | ICD-10-CM | POA: Insufficient documentation

## 2019-04-10 DIAGNOSIS — K5900 Constipation, unspecified: Secondary | ICD-10-CM | POA: Diagnosis not present

## 2019-04-10 DIAGNOSIS — K2970 Gastritis, unspecified, without bleeding: Secondary | ICD-10-CM | POA: Insufficient documentation

## 2019-04-10 DIAGNOSIS — I10 Essential (primary) hypertension: Secondary | ICD-10-CM | POA: Insufficient documentation

## 2019-04-10 DIAGNOSIS — K222 Esophageal obstruction: Secondary | ICD-10-CM | POA: Insufficient documentation

## 2019-04-10 DIAGNOSIS — F319 Bipolar disorder, unspecified: Secondary | ICD-10-CM | POA: Insufficient documentation

## 2019-04-10 DIAGNOSIS — R5382 Chronic fatigue, unspecified: Secondary | ICD-10-CM | POA: Insufficient documentation

## 2019-04-10 DIAGNOSIS — Z79899 Other long term (current) drug therapy: Secondary | ICD-10-CM | POA: Insufficient documentation

## 2019-04-10 DIAGNOSIS — Z7951 Long term (current) use of inhaled steroids: Secondary | ICD-10-CM | POA: Diagnosis not present

## 2019-04-10 DIAGNOSIS — Z7982 Long term (current) use of aspirin: Secondary | ICD-10-CM | POA: Diagnosis not present

## 2019-04-10 DIAGNOSIS — I2510 Atherosclerotic heart disease of native coronary artery without angina pectoris: Secondary | ICD-10-CM | POA: Diagnosis not present

## 2019-04-10 DIAGNOSIS — M1990 Unspecified osteoarthritis, unspecified site: Secondary | ICD-10-CM | POA: Insufficient documentation

## 2019-04-10 DIAGNOSIS — Z880 Allergy status to penicillin: Secondary | ICD-10-CM | POA: Insufficient documentation

## 2019-04-10 DIAGNOSIS — R1310 Dysphagia, unspecified: Secondary | ICD-10-CM | POA: Diagnosis not present

## 2019-04-10 DIAGNOSIS — Z881 Allergy status to other antibiotic agents status: Secondary | ICD-10-CM | POA: Diagnosis not present

## 2019-04-10 DIAGNOSIS — K2960 Other gastritis without bleeding: Secondary | ICD-10-CM | POA: Diagnosis not present

## 2019-04-10 HISTORY — DX: Spinal stenosis, lumbar region without neurogenic claudication: M48.061

## 2019-04-10 HISTORY — DX: Personal history of colonic polyps: Z86.010

## 2019-04-10 HISTORY — DX: Personal history of colon polyps, unspecified: Z86.0100

## 2019-04-10 HISTORY — DX: Age-related osteoporosis without current pathological fracture: M81.0

## 2019-04-10 HISTORY — DX: Bipolar disorder, unspecified: F31.9

## 2019-04-10 HISTORY — DX: Other constipation: K59.09

## 2019-04-10 HISTORY — DX: Chronic fatigue, unspecified: R53.82

## 2019-04-10 HISTORY — DX: Unspecified iridocyclitis: H20.9

## 2019-04-10 HISTORY — PX: ESOPHAGOGASTRODUODENOSCOPY (EGD) WITH PROPOFOL: SHX5813

## 2019-04-10 HISTORY — PX: COLONOSCOPY WITH PROPOFOL: SHX5780

## 2019-04-10 HISTORY — DX: Essential (primary) hypertension: I10

## 2019-04-10 HISTORY — DX: Cerebral infarction, unspecified: I63.9

## 2019-04-10 HISTORY — DX: Myalgic encephalomyelitis/chronic fatigue syndrome: G93.32

## 2019-04-10 SURGERY — ESOPHAGOGASTRODUODENOSCOPY (EGD) WITH PROPOFOL
Anesthesia: General

## 2019-04-10 MED ORDER — LIDOCAINE HCL (PF) 2 % IJ SOLN
INTRAMUSCULAR | Status: DC | PRN
  Administered 2019-04-10: 100 mg via INTRADERMAL

## 2019-04-10 MED ORDER — ONDANSETRON HCL 4 MG/2ML IJ SOLN
INTRAMUSCULAR | Status: DC | PRN
  Administered 2019-04-10: 4 mg via INTRAVENOUS

## 2019-04-10 MED ORDER — SODIUM CHLORIDE 0.9 % IV SOLN: INTRAVENOUS | Status: DC

## 2019-04-10 MED ORDER — PROPOFOL 500 MG/50ML IV EMUL
INTRAVENOUS | Status: DC | PRN
  Administered 2019-04-10: 150 ug/kg/min via INTRAVENOUS

## 2019-04-10 MED ORDER — PROPOFOL 10 MG/ML IV BOLUS
INTRAVENOUS | Status: DC | PRN
  Administered 2019-04-10: 20 mg via INTRAVENOUS
  Administered 2019-04-10: 50 mg via INTRAVENOUS

## 2019-04-10 NOTE — Op Note (Signed)
[facility] Patient Name: Madeline Cabrera Procedure Date: 04/10/2019 12:38 PM MRN: [id number] Account #: [id number] Date of Birth: Aug 22, 1945 Admit Type: Outpatient Age: 74 Room: [facility] ENDO ROOM 3 Gender: Female Note Status: Finalized Procedure:             Colonoscopy Indications:           Surveillance: Personal history of adenomatous polyps                         on last colonoscopy > 5 years ago Providers:             Lorie Apley K. Larayne Baxley MD, MD Medicines:             Propofol per Anesthesia Complications:         No immediate complications. Procedure:             Pre-Anesthesia Assessment:                        - The risks and benefits of the procedure and the                         sedation options and risks were discussed with the                         patient. All questions were answered and informed                         consent was obtained.                        - Patient identification and proposed procedure were                         verified prior to the procedure by the nurse. The                         procedure was verified in the procedure room.                        - ASA Grade Assessment: III - A patient with severe                         systemic disease.                        - After reviewing the risks and benefits, the patient                         was deemed in satisfactory condition to undergo the                         procedure.                        After obtaining informed consent, the colonoscope was                         passed under direct vision. Throughout the procedure,  the patient's blood pressure, pulse, and oxygen                         saturations were monitored continuously. The                         Colonoscope was introduced through the anus and                         advanced to the the cecum, identified by appendiceal                         orifice and  ileocecal valve. The colonoscopy was                         performed without difficulty. The patient tolerated                         the procedure well. The quality of the bowel                         preparation was good. The ileocecal valve, appendiceal                         orifice, and rectum were photographed. Findings:      The perianal and digital rectal examinations were normal. Pertinent       negatives include normal sphincter tone and no palpable rectal lesions.      The sigmoid colon was moderately tortuous. Advancing the scope required       changing the patient to a prone position.      The exam was otherwise without abnormality on direct and retroflexion       views. Impression:            - Tortuous colon.                        - The examination was otherwise normal on direct and                         retroflexion views.                        - No specimens collected. Recommendation:        - Await pathology results from EGD, also performed                         today.                        - Monitor results to esophageal dilation                        - Patient has a contact number available for                         emergencies. The signs and symptoms of potential                         delayed complications were discussed with the patient.  Return to normal activities tomorrow. Written                         discharge instructions were provided to the patient.                        - Resume previous diet.                        - Continue present medications.                        - No repeat colonoscopy due to current age (19 years                         or older) and the absence of colonic polyps.                        - Return to physician assistant in 6 weeks.                        - Follow up with Octavia Bruckner, PA-C in [ ]  months.                        - The findings and recommendations were discussed with                          the patient. Procedure Code(s):     --- Professional ---                        [id number], Colorectal cancer screening; colonoscopy on                         individual at high risk Diagnosis Code(s):     --- Professional ---                        Q43.8, Other specified congenital malformations of                         intestine                        Z86.010, Personal history of colonic polyps CPT copyright 2019 American Medical Association. All rights reserved. The codes documented in this report are preliminary and upon coder review may  be revised to meet current compliance requirements. Efrain Sella MD, MD 04/10/2019 1:13:47 PM This report has been signed electronically. Number of Addenda: 0 Note Initiated On: 04/10/2019 12:38 PM Scope Withdrawal Time: 0 hours 6 minutes 4 seconds  Total Procedure Duration: 0 hours 15 minutes 48 seconds  Estimated Blood Loss:  Estimated blood loss: none.      [facility]

## 2019-04-10 NOTE — H&P (Signed)
Outpatient short stay form Pre-procedure 04/10/2019 12:20 PM Teren Zurcher K. Alice Reichert, M.D.  Primary Physician: Frazier Richards, M.D.  Reason for visit:  Dysphagia, GERD, Personal hx of adenomatous colon polyps.  History of present illness:  As above. Patient denies change in bowel habits (has hx of chronic constipation but seems better controlled on Miralax),  rectal bleeding, weight loss or abdominal pain.      Current Facility-Administered Medications:  .  0.9 %  sodium chloride infusion, , Intravenous, Continuous, Ilhan Madan, Benay Pike, MD  Medications Prior to Admission  Medication Sig Dispense Refill Last Dose  . amLODipine (NORVASC) 2.5 MG tablet Take 2.5 mg by mouth daily.   04/09/2019 at Unknown time  . aspirin 81 MG tablet Take 81 mg by mouth daily.   Past Week at Unknown time  . carvedilol (COREG) 3.125 MG tablet Take 3.125 mg by mouth 2 (two) times daily with a meal.   04/09/2019 at Unknown time  . cetirizine (ZYRTEC) 10 MG tablet Take 10 mg by mouth daily.   04/09/2019 at Unknown time  . citalopram (CELEXA) 20 MG tablet Take 40 mg by mouth daily.    04/09/2019 at Unknown time  . cyclobenzaprine (FLEXERIL) 5 MG tablet Take 1 tablet (5 mg total) by mouth 3 (three) times daily as needed for muscle spasms. 15 tablet 0 Past Week at Unknown time  . Dexlansoprazole (DEXILANT) 30 MG capsule Take 30 mg by mouth daily.   04/09/2019 at Unknown time  . docusate sodium (COLACE) 100 MG capsule Take 1 capsule (100 mg total) by mouth daily as needed. 30 capsule 2 Past Week at Unknown time  . famotidine (PEPCID) 20 MG tablet Take 20 mg by mouth 2 (two) times daily.     . Fluticasone-Salmeterol (ADVAIR) 100-50 MCG/DOSE AEPB Inhale 1 puff into the lungs 2 (two) times daily.     Marland Kitchen losartan (COZAAR) 25 MG tablet Take 25 mg by mouth daily.   04/09/2019 at Unknown time  . lovastatin (MEVACOR) 40 MG tablet Take 40 mg by mouth at bedtime.   04/09/2019 at Unknown time  . lubiprostone (AMITIZA) 8 MCG capsule Take 8 mcg by  mouth 2 (two) times daily with a meal.     . pantoprazole (PROTONIX) 40 MG tablet Take 40 mg by mouth 2 (two) times daily before a meal.     . polyethylene glycol (MIRALAX) 17 g packet Take 17 g by mouth daily. 14 each 0 Past Week at Unknown time  . traMADol (ULTRAM) 50 MG tablet Take by mouth 2 (two) times daily as needed.   04/09/2019 at Unknown time  . cholecalciferol (VITAMIN D) 1000 UNITS tablet Take 1,000 Units by mouth daily.     . DiphenhydrAMINE HCl (ALLERGY MEDICATION PO) Take 1 tablet by mouth every evening.      . estrogens, conjugated, (PREMARIN) 1.25 MG tablet Take 1.25 mg by mouth every 7 (seven) days. Reported on 12/24/2014     . etodolac (LODINE) 200 MG capsule Take 1 capsule (200 mg total) by mouth every 8 (eight) hours. 12 capsule 0   . Hypromellose (GENTEAL OP) Apply 2 drops to eye 2 (two) times daily.     Marland Kitchen omeprazole (PRILOSEC) 40 MG capsule Take 40 mg by mouth 2 (two) times daily.     Marland Kitchen oxyCODONE-acetaminophen (PERCOCET) 7.5-325 MG tablet Take 1 tablet by mouth every 4 (four) hours as needed for severe pain. 30 tablet 0   . oxyCODONE-acetaminophen (ROXICET) 5-325 MG tablet Take 1 tablet by  mouth every 6 (six) hours as needed. 12 tablet 0   . predniSONE (DELTASONE) 10 MG tablet Take 1 tablet (10 mg total) by mouth 2 (two) times daily with a meal. 10 tablet 0   . scopolamine (TRANSDERM-SCOP) 1 MG/3DAYS Place 1 patch (1.5 mg total) onto the skin every 3 (three) days. 10 patch 12   . White Petrolatum-Mineral Oil (GENTEAL PM) 85-15 % OINT Apply 1 application to eye every evening.        Allergies  Allergen Reactions  . Clindamycin/Lincomycin     UNKNOWN  . Doxycycline Nausea Only  . Hydrocodone-Guaifenesin     UNKNOWN  . Minocycline Nausea Only  . Naprosyn [Naproxen]     UNKNOWN   . Biaxin [Clarithromycin] Rash  . Penicillins Rash     Past Medical History:  Diagnosis Date  . Anxiety   . Arthritis    OSTEOARTHRITIS  . Bipolar disorder ([place])   . Chronic fatigue  syndrome   . Constipation, chronic   . Coronary artery disease   . GERD (gastroesophageal reflux disease)   . Headache(784.0)   . Hx of basal cell carcinoma 05/15/2018   L upper lip below nasolabial  . Hx of dysplastic nevus 03/12/2018   L medial shoulder, severe atypia  . Hyperlipidemia   . Hypertension   . Iritis   . Lumbar stenosis   . Osteoporosis   . Personal history of colonic polyps   . PONV (postoperative nausea and vomiting)   . Renal insufficiency   . Stroke [facility])    TIAs x2    Review of systems:  Otherwise negative.    Physical Exam  Gen: Alert, oriented. Appears stated age.  HEENT: [place]/AT. PERRLA. Lungs: CTA, no wheezes. CV: RR nl S1, S2. Abd: soft, benign, no masses. BS+ Ext: No edema. Pulses 2+    Planned procedures: Proceed with EGD and colonoscopy. The patient understands the nature of the planned procedure, indications, risks, alternatives and potential complications including but not limited to bleeding, infection, perforation, damage to internal organs and possible oversedation/side effects from anesthesia. The patient agrees and gives consent to proceed.  Please refer to procedure notes for findings, recommendations and patient disposition/instructions.     Babacar Haycraft K. Alice Reichert, M.D. Gastroenterology 04/10/2019  12:20 PM

## 2019-04-10 NOTE — Interval H&P Note (Signed)
History and Physical Interval Note:  04/10/2019 12:23 PM  Madeline Cabrera  has presented today for surgery, with the diagnosis of constipation, Hx colon polyps, dysphagia and GERD.  The various methods of treatment have been discussed with the patient and family. After consideration of risks, benefits and other options for treatment, the patient has consented to  Procedure(s): ESOPHAGOGASTRODUODENOSCOPY (EGD) WITH PROPOFOL (N/A) COLONOSCOPY WITH PROPOFOL (N/A) as a surgical intervention.  The patient's history has been reviewed, patient examined, no change in status, stable for surgery.  I have reviewed the patient's chart and labs.  Questions were answered to the patient's satisfaction.     [place], [place]

## 2019-04-10 NOTE — Anesthesia Postprocedure Evaluation (Signed)
Anesthesia Post Note  Patient: Madeline Cabrera  Procedure(s) Performed: ESOPHAGOGASTRODUODENOSCOPY (EGD) WITH PROPOFOL (N/A ) COLONOSCOPY WITH PROPOFOL (N/A )  Patient location during evaluation: Endoscopy Anesthesia Type: General Level of consciousness: awake and alert and oriented Pain management: pain level controlled Vital Signs Assessment: post-procedure vital signs reviewed and stable Respiratory status: spontaneous breathing, nonlabored ventilation and respiratory function stable Cardiovascular status: blood pressure returned to baseline and stable Postop Assessment: no signs of nausea or vomiting Anesthetic complications: no     Last Vitals:  Vitals:   04/10/19 1326 04/10/19 1336  BP: (!) 166/68   Pulse: 72 74  Resp: 19 (!) 27  Temp:    SpO2: 97% 99%    Last Pain:  Vitals:   04/10/19 1316  TempSrc:   PainSc: Asleep                 Bristyl Mclees

## 2019-04-10 NOTE — Anesthesia Preprocedure Evaluation (Signed)
Anesthesia Evaluation  Patient identified by MRN, date of birth, ID band Patient awake    Reviewed: Allergy & Precautions, NPO status , Patient's Chart, lab work & pertinent test results  History of Anesthesia Complications (+) PONV and history of anesthetic complications  Airway Mallampati: II  TM Distance: >3 FB Neck ROM: Full    Dental  (+) Implants   Pulmonary neg sleep apnea, neg COPD, former smoker,    breath sounds clear to auscultation- rhonchi (-) wheezing      Cardiovascular Exercise Tolerance: Good hypertension, Pt. on medications + CAD and + Cardiac Stents (2015)  (-) CABG  Rhythm:Regular Rate:Normal - Systolic murmurs and - Diastolic murmurs    Neuro/Psych  Headaches, PSYCHIATRIC DISORDERS Anxiety Bipolar Disorder CVA, No Residual Symptoms    GI/Hepatic Neg liver ROS, GERD  ,  Endo/Other  negative endocrine ROSneg diabetes  Renal/GU Renal InsufficiencyRenal disease     Musculoskeletal  (+) Arthritis ,   Abdominal (+) - obese,   Peds  Hematology negative hematology ROS (+)   Anesthesia Other Findings Past Medical History: No date: Anxiety No date: Arthritis     Comment:  OSTEOARTHRITIS No date: Bipolar disorder (HCC) No date: Chronic fatigue syndrome No date: Constipation, chronic No date: Coronary artery disease No date: GERD (gastroesophageal reflux disease) No date: Headache(784.0) 05/15/2018: Hx of basal cell carcinoma     Comment:  L upper lip below nasolabial 03/12/2018: Hx of dysplastic nevus     Comment:  L medial shoulder, severe atypia No date: Hyperlipidemia No date: Hypertension No date: Iritis No date: Lumbar stenosis No date: Osteoporosis No date: Personal history of colonic polyps No date: PONV (postoperative nausea and vomiting) No date: Renal insufficiency No date: Stroke [facility])     Comment:  TIAs x2   Reproductive/Obstetrics                              Anesthesia Physical Anesthesia Plan  ASA: III  Anesthesia Plan: General   Post-op Pain Management:    Induction: Intravenous  PONV Risk Score and Plan: 3 and Propofol infusion  Airway Management Planned: Natural Airway  Additional Equipment:   Intra-op Plan:   Post-operative Plan:   Informed Consent: I have reviewed the patients History and Physical, chart, labs and discussed the procedure including the risks, benefits and alternatives for the proposed anesthesia with the patient or authorized representative who has indicated his/her understanding and acceptance.     Dental advisory given  Plan Discussed with: CRNA and Anesthesiologist  Anesthesia Plan Comments:         Anesthesia Quick Evaluation

## 2019-04-10 NOTE — Transfer of Care (Signed)
Immediate Anesthesia Transfer of Care Note  Patient: Madeline Cabrera  Procedure(s) Performed: ESOPHAGOGASTRODUODENOSCOPY (EGD) WITH PROPOFOL (N/A ) COLONOSCOPY WITH PROPOFOL (N/A )  Patient Location: PACU  Anesthesia Type:General  Level of Consciousness: sedated  Airway & Oxygen Therapy: Patient Spontanous Breathing and Patient connected to nasal cannula oxygen  Post-op Assessment: Report given to RN and Post -op Vital signs reviewed and stable  Post vital signs: Reviewed and stable  Last Vitals:  Vitals Value Taken Time  BP 175/71 04/10/19 1315  Temp    Pulse 79 04/10/19 1315  Resp 18 04/10/19 1315  SpO2 97 % 04/10/19 1315  Vitals shown include unvalidated device data.  Last Pain:  Vitals:   04/10/19 1213  TempSrc: Temporal  PainSc: 0-No pain         Complications: No apparent anesthesia complications

## 2019-04-10 NOTE — Op Note (Signed)
[facility] Gastroenterology Patient Name: Madeline Cabrera Procedure Date: 04/10/2019 12:38 PM MRN: [id number] Account #: [id number] Date of Birth: 04-25-1945 Admit Type: Outpatient Age: 74 Room: [facility] ENDO ROOM 3 Gender: Female Note Status: Finalized Procedure:             Upper GI endoscopy Indications:           Esophageal dysphagia, Follow-up of esophageal reflux Providers:             Benay Pike. Sherrell Farish MD, MD Medicines:             Propofol per Anesthesia Complications:         No immediate complications. Procedure:             Pre-Anesthesia Assessment:                        - The risks and benefits of the procedure and the                         sedation options and risks were discussed with the                         patient. All questions were answered and informed                         consent was obtained.                        - Patient identification and proposed procedure were                         verified prior to the procedure by the nurse. The                         procedure was verified in the procedure room.                        - ASA Grade Assessment: III - A patient with severe                         systemic disease.                        - After reviewing the risks and benefits, the patient                         was deemed in satisfactory condition to undergo the                         procedure.                        After obtaining informed consent, the endoscope was                         passed under direct vision. Throughout the procedure,                         the patient's blood pressure, pulse, and oxygen  saturations were monitored continuously. The Endoscope                         was introduced through the mouth, and advanced to the                         third part of duodenum. The upper GI endoscopy was                         accomplished without difficulty. The patient tolerated                        the procedure well. Findings:      A non-obstructing Schatzki ring was found in the distal esophagus. The       scope was withdrawn. Dilation was performed with a Maloney dilator with       mild resistance at 20 Fr.      Patchy moderate inflammation characterized by congestion (edema) and       erythema was found in the gastric antrum. Biopsies were taken with a       cold forceps for Helicobacter pylori testing.      The cardia and gastric fundus were normal on retroflexion.      The examined duodenum was normal.      The exam was otherwise without abnormality. Impression:            - Non-obstructing Schatzki ring. Dilated.                        - Gastritis. Biopsied.                        - Normal examined duodenum.                        - The examination was otherwise normal. Recommendation:        - Await pathology results.                        - Await pathology results from EGD, also performed                         today.                        - Monitor results to esophageal dilation                        - Proceed with colonoscopy Procedure Code(s):     --- Professional ---                        [phone], Esophagogastroduodenoscopy, flexible,                         transoral; with biopsy, single or multiple                        43450, Dilation of esophagus, by unguided sound or                         bougie, single or multiple passes Diagnosis  Code(s):     --- Professional ---                        K21.9, Gastro-esophageal reflux disease without                         esophagitis                        R13.14, Dysphagia, pharyngoesophageal phase                        K29.70, Gastritis, unspecified, without bleeding                        K22.2, Esophageal obstruction CPT copyright 2019 American Medical Association. All rights reserved. The codes documented in this report are preliminary and upon coder review may  be revised to meet current  compliance requirements. Efrain Sella MD, MD 04/10/2019 12:52:17 PM This report has been signed electronically. Number of Addenda: 0 Note Initiated On: 04/10/2019 12:38 PM Estimated Blood Loss:  Estimated blood loss: none.      [facility]

## 2019-04-11 ENCOUNTER — Encounter: Payer: Self-pay | Admitting: *Deleted

## 2019-04-11 LAB — SURGICAL PATHOLOGY

## 2019-04-17 DIAGNOSIS — H353231 Exudative age-related macular degeneration, bilateral, with active choroidal neovascularization: Secondary | ICD-10-CM | POA: Diagnosis not present

## 2019-04-19 DIAGNOSIS — M7061 Trochanteric bursitis, right hip: Secondary | ICD-10-CM | POA: Diagnosis not present

## 2019-04-19 DIAGNOSIS — M7062 Trochanteric bursitis, left hip: Secondary | ICD-10-CM | POA: Diagnosis not present

## 2019-04-19 DIAGNOSIS — M5136 Other intervertebral disc degeneration, lumbar region: Secondary | ICD-10-CM | POA: Diagnosis not present

## 2019-04-19 DIAGNOSIS — M48062 Spinal stenosis, lumbar region with neurogenic claudication: Secondary | ICD-10-CM | POA: Diagnosis not present

## 2019-05-28 DIAGNOSIS — K219 Gastro-esophageal reflux disease without esophagitis: Secondary | ICD-10-CM | POA: Diagnosis not present

## 2019-05-28 DIAGNOSIS — K5904 Chronic idiopathic constipation: Secondary | ICD-10-CM | POA: Diagnosis not present

## 2019-05-28 DIAGNOSIS — F319 Bipolar disorder, unspecified: Secondary | ICD-10-CM | POA: Diagnosis not present

## 2019-05-28 DIAGNOSIS — Z86010 Personal history of colonic polyps: Secondary | ICD-10-CM | POA: Diagnosis not present

## 2019-05-28 DIAGNOSIS — Q438 Other specified congenital malformations of intestine: Secondary | ICD-10-CM | POA: Diagnosis not present

## 2019-05-28 DIAGNOSIS — K222 Esophageal obstruction: Secondary | ICD-10-CM | POA: Diagnosis not present

## 2019-05-28 DIAGNOSIS — I5022 Chronic systolic (congestive) heart failure: Secondary | ICD-10-CM | POA: Diagnosis not present

## 2019-05-30 DIAGNOSIS — H353221 Exudative age-related macular degeneration, left eye, with active choroidal neovascularization: Secondary | ICD-10-CM | POA: Diagnosis not present

## 2019-07-05 DIAGNOSIS — N1832 Chronic kidney disease, stage 3b: Secondary | ICD-10-CM | POA: Diagnosis not present

## 2019-07-05 DIAGNOSIS — I2510 Atherosclerotic heart disease of native coronary artery without angina pectoris: Secondary | ICD-10-CM | POA: Diagnosis not present

## 2019-07-05 DIAGNOSIS — I10 Essential (primary) hypertension: Secondary | ICD-10-CM | POA: Diagnosis not present

## 2019-07-10 DIAGNOSIS — H353231 Exudative age-related macular degeneration, bilateral, with active choroidal neovascularization: Secondary | ICD-10-CM | POA: Diagnosis not present

## 2019-07-11 DIAGNOSIS — I2510 Atherosclerotic heart disease of native coronary artery without angina pectoris: Secondary | ICD-10-CM | POA: Diagnosis not present

## 2019-07-11 DIAGNOSIS — N1832 Chronic kidney disease, stage 3b: Secondary | ICD-10-CM | POA: Diagnosis not present

## 2019-07-11 DIAGNOSIS — I5022 Chronic systolic (congestive) heart failure: Secondary | ICD-10-CM | POA: Diagnosis not present

## 2019-07-11 DIAGNOSIS — Z0000 Encounter for general adult medical examination without abnormal findings: Secondary | ICD-10-CM | POA: Diagnosis not present

## 2019-07-11 DIAGNOSIS — F319 Bipolar disorder, unspecified: Secondary | ICD-10-CM | POA: Diagnosis not present

## 2019-07-11 DIAGNOSIS — I700 Atherosclerosis of aorta: Secondary | ICD-10-CM | POA: Diagnosis not present

## 2019-07-11 DIAGNOSIS — Z9989 Dependence on other enabling machines and devices: Secondary | ICD-10-CM | POA: Diagnosis not present

## 2019-07-11 DIAGNOSIS — I779 Disorder of arteries and arterioles, unspecified: Secondary | ICD-10-CM | POA: Diagnosis not present

## 2019-07-11 DIAGNOSIS — R7309 Other abnormal glucose: Secondary | ICD-10-CM | POA: Diagnosis not present

## 2019-08-01 DIAGNOSIS — H6980 Other specified disorders of Eustachian tube, unspecified ear: Secondary | ICD-10-CM | POA: Diagnosis not present

## 2019-08-01 DIAGNOSIS — H9312 Tinnitus, left ear: Secondary | ICD-10-CM | POA: Diagnosis not present

## 2019-08-01 DIAGNOSIS — J301 Allergic rhinitis due to pollen: Secondary | ICD-10-CM | POA: Diagnosis not present

## 2019-08-01 DIAGNOSIS — H903 Sensorineural hearing loss, bilateral: Secondary | ICD-10-CM | POA: Diagnosis not present

## 2019-08-01 DIAGNOSIS — H6123 Impacted cerumen, bilateral: Secondary | ICD-10-CM | POA: Diagnosis not present

## 2019-08-21 DIAGNOSIS — H353221 Exudative age-related macular degeneration, left eye, with active choroidal neovascularization: Secondary | ICD-10-CM | POA: Diagnosis not present

## 2019-09-03 DIAGNOSIS — M5136 Other intervertebral disc degeneration, lumbar region: Secondary | ICD-10-CM | POA: Diagnosis not present

## 2019-09-03 DIAGNOSIS — M48062 Spinal stenosis, lumbar region with neurogenic claudication: Secondary | ICD-10-CM | POA: Diagnosis not present

## 2019-09-03 DIAGNOSIS — Z79899 Other long term (current) drug therapy: Secondary | ICD-10-CM | POA: Diagnosis not present

## 2019-09-03 DIAGNOSIS — M5416 Radiculopathy, lumbar region: Secondary | ICD-10-CM | POA: Diagnosis not present

## 2019-09-03 DIAGNOSIS — M62830 Muscle spasm of back: Secondary | ICD-10-CM | POA: Diagnosis not present

## 2019-09-23 DIAGNOSIS — M5136 Other intervertebral disc degeneration, lumbar region: Secondary | ICD-10-CM | POA: Diagnosis not present

## 2019-09-23 DIAGNOSIS — M5416 Radiculopathy, lumbar region: Secondary | ICD-10-CM | POA: Diagnosis not present

## 2019-09-23 DIAGNOSIS — M48062 Spinal stenosis, lumbar region with neurogenic claudication: Secondary | ICD-10-CM | POA: Diagnosis not present

## 2019-10-02 DIAGNOSIS — H353231 Exudative age-related macular degeneration, bilateral, with active choroidal neovascularization: Secondary | ICD-10-CM | POA: Diagnosis not present

## 2019-10-10 ENCOUNTER — Other Ambulatory Visit: Payer: Self-pay | Admitting: Otolaryngology

## 2019-10-10 DIAGNOSIS — H9312 Tinnitus, left ear: Secondary | ICD-10-CM

## 2019-10-14 DIAGNOSIS — I651 Occlusion and stenosis of basilar artery: Secondary | ICD-10-CM | POA: Diagnosis not present

## 2019-10-14 DIAGNOSIS — I2510 Atherosclerotic heart disease of native coronary artery without angina pectoris: Secondary | ICD-10-CM | POA: Diagnosis not present

## 2019-10-14 DIAGNOSIS — I700 Atherosclerosis of aorta: Secondary | ICD-10-CM | POA: Diagnosis not present

## 2019-10-14 DIAGNOSIS — I10 Essential (primary) hypertension: Secondary | ICD-10-CM | POA: Diagnosis not present

## 2019-10-21 ENCOUNTER — Ambulatory Visit: Payer: PPO

## 2019-10-24 DIAGNOSIS — M5416 Radiculopathy, lumbar region: Secondary | ICD-10-CM | POA: Diagnosis not present

## 2019-10-24 DIAGNOSIS — M62830 Muscle spasm of back: Secondary | ICD-10-CM | POA: Diagnosis not present

## 2019-10-24 DIAGNOSIS — M48062 Spinal stenosis, lumbar region with neurogenic claudication: Secondary | ICD-10-CM | POA: Diagnosis not present

## 2019-10-24 DIAGNOSIS — M5136 Other intervertebral disc degeneration, lumbar region: Secondary | ICD-10-CM | POA: Diagnosis not present

## 2019-11-20 ENCOUNTER — Ambulatory Visit: Payer: PPO | Admitting: Dermatology

## 2019-11-20 ENCOUNTER — Other Ambulatory Visit: Payer: Self-pay

## 2019-11-20 DIAGNOSIS — L820 Inflamed seborrheic keratosis: Secondary | ICD-10-CM

## 2019-11-20 DIAGNOSIS — L821 Other seborrheic keratosis: Secondary | ICD-10-CM | POA: Diagnosis not present

## 2019-11-20 DIAGNOSIS — L814 Other melanin hyperpigmentation: Secondary | ICD-10-CM | POA: Diagnosis not present

## 2019-11-20 DIAGNOSIS — Z85828 Personal history of other malignant neoplasm of skin: Secondary | ICD-10-CM

## 2019-11-20 NOTE — Progress Notes (Signed)
   Follow-Up Visit   Subjective  Madeline Cabrera is a 74 y.o. female who presents for the following: lesion (Left forearm. Pink, scaly. Will not resolved. ).  She tends to pick at it.  She has a h/o BCC on the L upper lip.    The following portions of the chart were reviewed this encounter and updated as appropriate:     Review of Systems: No other skin or systemic complaints except as noted in HPI or Assessment and Plan.  Objective  Well appearing patient in no apparent distress; mood and affect are within normal limits.  A focused examination was performed including face, forearms. Relevant physical exam findings are noted in the Assessment and Plan.    Objective  Left Forearm - Posterior x1: Pink firm scaly papule   Assessment & Plan  Inflamed seborrheic keratosis Left Forearm - Posterior x1  With component of Prurigo nodularis Cryotherapy today Avoid picking/scratching area.  Destruction of lesion - Left Forearm - Posterior x1  Destruction method: cryotherapy   Informed consent: discussed and consent obtained   Lesion destroyed using liquid nitrogen: Yes   Region frozen until ice ball extended beyond lesion: Yes   Outcome: patient tolerated procedure well with no complications   Post-procedure details: wound care instructions given    History of Basal Cell Carcinoma of the Skin - No evidence of recurrence today left upper lip below nasolabial - Recommend regular full body skin exams - Recommend daily broad spectrum sunscreen SPF 30+ to sun-exposed areas, reapply every 2 hours as needed.  - Call if any new or changing lesions are noted between office visits  Seborrheic Keratoses - Stuck-on, waxy, tan-brown papules and plaques at arms - Discussed benign etiology and prognosis. - Observe - Call for any changes  Lentigines - Scattered tan macules at face and arms - Discussed due to sun exposure - Benign, observe - Call for any changes Return in about 2 months  (around 01/20/2020) for recheck and full skin check.   I, Emelia Salisbury, CMA, am acting as scribe for Brendolyn Patty, MD.  Documentation: I have reviewed the above documentation for accuracy and completeness, and I agree with the above.  Brendolyn Patty MD

## 2019-11-20 NOTE — Patient Instructions (Signed)
Cryotherapy Aftercare  . Wash gently with soap and water everyday.   . Apply Vaseline and Band-Aid daily until healed.  Prior to procedure, discussed risks of blister formation, small wound, skin dyspigmentation, or rare scar following cryotherapy.   

## 2019-11-27 DIAGNOSIS — H353221 Exudative age-related macular degeneration, left eye, with active choroidal neovascularization: Secondary | ICD-10-CM | POA: Diagnosis not present

## 2019-12-03 ENCOUNTER — Other Ambulatory Visit: Payer: Self-pay | Admitting: Internal Medicine

## 2019-12-03 DIAGNOSIS — Z1231 Encounter for screening mammogram for malignant neoplasm of breast: Secondary | ICD-10-CM

## 2019-12-20 DIAGNOSIS — M5137 Other intervertebral disc degeneration, lumbosacral region: Secondary | ICD-10-CM | POA: Diagnosis not present

## 2019-12-20 DIAGNOSIS — M5416 Radiculopathy, lumbar region: Secondary | ICD-10-CM | POA: Diagnosis not present

## 2019-12-20 DIAGNOSIS — M48062 Spinal stenosis, lumbar region with neurogenic claudication: Secondary | ICD-10-CM | POA: Diagnosis not present

## 2019-12-25 DIAGNOSIS — H353211 Exudative age-related macular degeneration, right eye, with active choroidal neovascularization: Secondary | ICD-10-CM | POA: Diagnosis not present

## 2020-01-02 DIAGNOSIS — Z03818 Encounter for observation for suspected exposure to other biological agents ruled out: Secondary | ICD-10-CM | POA: Diagnosis not present

## 2020-01-02 DIAGNOSIS — Z1152 Encounter for screening for COVID-19: Secondary | ICD-10-CM | POA: Diagnosis not present

## 2020-01-08 ENCOUNTER — Ambulatory Visit
Admission: RE | Admit: 2020-01-08 | Discharge: 2020-01-08 | Disposition: A | Discharge status: Home / Self Care | Payer: PPO | Source: Ambulatory Visit | Attending: Internal Medicine | Admitting: Internal Medicine

## 2020-01-08 ENCOUNTER — Other Ambulatory Visit: Payer: Self-pay

## 2020-01-08 DIAGNOSIS — Z1231 Encounter for screening mammogram for malignant neoplasm of breast: Secondary | ICD-10-CM | POA: Diagnosis not present

## 2020-01-13 DIAGNOSIS — F319 Bipolar disorder, unspecified: Secondary | ICD-10-CM | POA: Diagnosis not present

## 2020-01-13 DIAGNOSIS — Z9989 Dependence on other enabling machines and devices: Secondary | ICD-10-CM | POA: Diagnosis not present

## 2020-01-13 DIAGNOSIS — I2510 Atherosclerotic heart disease of native coronary artery without angina pectoris: Secondary | ICD-10-CM | POA: Diagnosis not present

## 2020-01-13 DIAGNOSIS — Z1331 Encounter for screening for depression: Secondary | ICD-10-CM | POA: Diagnosis not present

## 2020-01-13 DIAGNOSIS — Z0000 Encounter for general adult medical examination without abnormal findings: Secondary | ICD-10-CM | POA: Diagnosis not present

## 2020-01-13 DIAGNOSIS — N1832 Chronic kidney disease, stage 3b: Secondary | ICD-10-CM | POA: Diagnosis not present

## 2020-01-13 DIAGNOSIS — I10 Essential (primary) hypertension: Secondary | ICD-10-CM | POA: Diagnosis not present

## 2020-01-13 DIAGNOSIS — R7309 Other abnormal glucose: Secondary | ICD-10-CM | POA: Diagnosis not present

## 2020-01-13 DIAGNOSIS — I700 Atherosclerosis of aorta: Secondary | ICD-10-CM | POA: Diagnosis not present

## 2020-01-13 DIAGNOSIS — M791 Myalgia, unspecified site: Secondary | ICD-10-CM | POA: Diagnosis not present

## 2020-01-13 DIAGNOSIS — R7303 Prediabetes: Secondary | ICD-10-CM | POA: Diagnosis not present

## 2020-01-13 DIAGNOSIS — I5022 Chronic systolic (congestive) heart failure: Secondary | ICD-10-CM | POA: Diagnosis not present

## 2020-01-13 DIAGNOSIS — I779 Disorder of arteries and arterioles, unspecified: Secondary | ICD-10-CM | POA: Diagnosis not present

## 2020-01-21 ENCOUNTER — Ambulatory Visit: Payer: PPO | Admitting: Dermatology

## 2020-02-03 ENCOUNTER — Other Ambulatory Visit: Payer: Self-pay | Admitting: Physical Medicine and Rehabilitation

## 2020-02-03 DIAGNOSIS — M5126 Other intervertebral disc displacement, lumbar region: Secondary | ICD-10-CM | POA: Diagnosis not present

## 2020-02-03 DIAGNOSIS — M5441 Lumbago with sciatica, right side: Secondary | ICD-10-CM | POA: Diagnosis not present

## 2020-02-03 DIAGNOSIS — M5416 Radiculopathy, lumbar region: Secondary | ICD-10-CM | POA: Diagnosis not present

## 2020-02-03 DIAGNOSIS — M5442 Lumbago with sciatica, left side: Secondary | ICD-10-CM | POA: Diagnosis not present

## 2020-02-05 DIAGNOSIS — H353231 Exudative age-related macular degeneration, bilateral, with active choroidal neovascularization: Secondary | ICD-10-CM | POA: Diagnosis not present

## 2020-02-08 ENCOUNTER — Ambulatory Visit
Admission: RE | Admit: 2020-02-08 | Discharge: 2020-02-08 | Disposition: A | Discharge status: Home / Self Care | Payer: PPO | Source: Ambulatory Visit | Attending: Physical Medicine and Rehabilitation | Admitting: Physical Medicine and Rehabilitation

## 2020-02-08 ENCOUNTER — Other Ambulatory Visit: Payer: Self-pay

## 2020-02-08 DIAGNOSIS — M545 Low back pain, unspecified: Secondary | ICD-10-CM | POA: Diagnosis not present

## 2020-02-08 DIAGNOSIS — M5441 Lumbago with sciatica, right side: Secondary | ICD-10-CM | POA: Insufficient documentation

## 2020-02-08 DIAGNOSIS — M5442 Lumbago with sciatica, left side: Secondary | ICD-10-CM | POA: Insufficient documentation

## 2020-02-19 DIAGNOSIS — M7062 Trochanteric bursitis, left hip: Secondary | ICD-10-CM | POA: Diagnosis not present

## 2020-02-19 DIAGNOSIS — M5126 Other intervertebral disc displacement, lumbar region: Secondary | ICD-10-CM | POA: Diagnosis not present

## 2020-02-19 DIAGNOSIS — M5136 Other intervertebral disc degeneration, lumbar region: Secondary | ICD-10-CM | POA: Diagnosis not present

## 2020-02-19 DIAGNOSIS — M48062 Spinal stenosis, lumbar region with neurogenic claudication: Secondary | ICD-10-CM | POA: Diagnosis not present

## 2020-02-19 DIAGNOSIS — M5416 Radiculopathy, lumbar region: Secondary | ICD-10-CM | POA: Diagnosis not present

## 2020-03-10 DIAGNOSIS — M7062 Trochanteric bursitis, left hip: Secondary | ICD-10-CM | POA: Diagnosis not present

## 2020-03-10 DIAGNOSIS — M48062 Spinal stenosis, lumbar region with neurogenic claudication: Secondary | ICD-10-CM | POA: Diagnosis not present

## 2020-03-10 DIAGNOSIS — M5416 Radiculopathy, lumbar region: Secondary | ICD-10-CM | POA: Diagnosis not present

## 2020-03-10 DIAGNOSIS — M5136 Other intervertebral disc degeneration, lumbar region: Secondary | ICD-10-CM | POA: Diagnosis not present

## 2020-03-10 DIAGNOSIS — M7061 Trochanteric bursitis, right hip: Secondary | ICD-10-CM | POA: Diagnosis not present

## 2020-03-31 DIAGNOSIS — M48062 Spinal stenosis, lumbar region with neurogenic claudication: Secondary | ICD-10-CM | POA: Diagnosis not present

## 2020-03-31 DIAGNOSIS — M5136 Other intervertebral disc degeneration, lumbar region: Secondary | ICD-10-CM | POA: Diagnosis not present

## 2020-03-31 DIAGNOSIS — M5416 Radiculopathy, lumbar region: Secondary | ICD-10-CM | POA: Diagnosis not present

## 2020-04-15 DIAGNOSIS — H353211 Exudative age-related macular degeneration, right eye, with active choroidal neovascularization: Secondary | ICD-10-CM | POA: Diagnosis not present

## 2020-04-15 DIAGNOSIS — H353221 Exudative age-related macular degeneration, left eye, with active choroidal neovascularization: Secondary | ICD-10-CM | POA: Diagnosis not present

## 2020-04-24 DIAGNOSIS — M5441 Lumbago with sciatica, right side: Secondary | ICD-10-CM | POA: Diagnosis not present

## 2020-04-24 DIAGNOSIS — M5442 Lumbago with sciatica, left side: Secondary | ICD-10-CM | POA: Diagnosis not present

## 2020-04-24 DIAGNOSIS — M5416 Radiculopathy, lumbar region: Secondary | ICD-10-CM | POA: Diagnosis not present

## 2020-04-24 DIAGNOSIS — M5136 Other intervertebral disc degeneration, lumbar region: Secondary | ICD-10-CM | POA: Diagnosis not present

## 2020-05-04 DIAGNOSIS — F319 Bipolar disorder, unspecified: Secondary | ICD-10-CM | POA: Diagnosis not present

## 2020-05-04 DIAGNOSIS — Q438 Other specified congenital malformations of intestine: Secondary | ICD-10-CM | POA: Diagnosis not present

## 2020-05-04 DIAGNOSIS — Z86010 Personal history of colonic polyps: Secondary | ICD-10-CM | POA: Diagnosis not present

## 2020-05-04 DIAGNOSIS — K5904 Chronic idiopathic constipation: Secondary | ICD-10-CM | POA: Diagnosis not present

## 2020-05-04 DIAGNOSIS — K219 Gastro-esophageal reflux disease without esophagitis: Secondary | ICD-10-CM | POA: Diagnosis not present

## 2020-05-05 DIAGNOSIS — I2510 Atherosclerotic heart disease of native coronary artery without angina pectoris: Secondary | ICD-10-CM | POA: Diagnosis not present

## 2020-05-05 DIAGNOSIS — I779 Disorder of arteries and arterioles, unspecified: Secondary | ICD-10-CM | POA: Diagnosis not present

## 2020-05-05 DIAGNOSIS — N1832 Chronic kidney disease, stage 3b: Secondary | ICD-10-CM | POA: Diagnosis not present

## 2020-05-05 DIAGNOSIS — F319 Bipolar disorder, unspecified: Secondary | ICD-10-CM | POA: Diagnosis not present

## 2020-05-05 DIAGNOSIS — I700 Atherosclerosis of aorta: Secondary | ICD-10-CM | POA: Diagnosis not present

## 2020-05-05 DIAGNOSIS — I10 Essential (primary) hypertension: Secondary | ICD-10-CM | POA: Diagnosis not present

## 2020-05-05 DIAGNOSIS — I5022 Chronic systolic (congestive) heart failure: Secondary | ICD-10-CM | POA: Diagnosis not present

## 2020-05-05 DIAGNOSIS — R7303 Prediabetes: Secondary | ICD-10-CM | POA: Diagnosis not present

## 2020-05-05 DIAGNOSIS — Z9989 Dependence on other enabling machines and devices: Secondary | ICD-10-CM | POA: Diagnosis not present

## 2020-06-05 DIAGNOSIS — I700 Atherosclerosis of aorta: Secondary | ICD-10-CM | POA: Diagnosis not present

## 2020-06-05 DIAGNOSIS — I10 Essential (primary) hypertension: Secondary | ICD-10-CM | POA: Diagnosis not present

## 2020-06-05 DIAGNOSIS — I6523 Occlusion and stenosis of bilateral carotid arteries: Secondary | ICD-10-CM | POA: Diagnosis not present

## 2020-06-05 DIAGNOSIS — I5022 Chronic systolic (congestive) heart failure: Secondary | ICD-10-CM | POA: Diagnosis not present

## 2020-06-05 DIAGNOSIS — I25118 Atherosclerotic heart disease of native coronary artery with other forms of angina pectoris: Secondary | ICD-10-CM | POA: Diagnosis not present

## 2020-06-11 DIAGNOSIS — M79641 Pain in right hand: Secondary | ICD-10-CM | POA: Diagnosis not present

## 2020-06-11 DIAGNOSIS — S6991XA Unspecified injury of right wrist, hand and finger(s), initial encounter: Secondary | ICD-10-CM | POA: Diagnosis not present

## 2020-07-01 DIAGNOSIS — Q438 Other specified congenital malformations of intestine: Secondary | ICD-10-CM | POA: Diagnosis not present

## 2020-07-01 DIAGNOSIS — K5904 Chronic idiopathic constipation: Secondary | ICD-10-CM | POA: Diagnosis not present

## 2020-07-01 DIAGNOSIS — K219 Gastro-esophageal reflux disease without esophagitis: Secondary | ICD-10-CM | POA: Diagnosis not present

## 2020-07-08 ENCOUNTER — Ambulatory Visit: Payer: PPO | Admitting: Dermatology

## 2020-07-08 DIAGNOSIS — H353231 Exudative age-related macular degeneration, bilateral, with active choroidal neovascularization: Secondary | ICD-10-CM | POA: Diagnosis not present

## 2020-07-13 DIAGNOSIS — R7303 Prediabetes: Secondary | ICD-10-CM | POA: Diagnosis not present

## 2020-07-13 DIAGNOSIS — R7309 Other abnormal glucose: Secondary | ICD-10-CM | POA: Diagnosis not present

## 2020-07-13 DIAGNOSIS — N1832 Chronic kidney disease, stage 3b: Secondary | ICD-10-CM | POA: Diagnosis not present

## 2020-07-13 DIAGNOSIS — I5022 Chronic systolic (congestive) heart failure: Secondary | ICD-10-CM | POA: Diagnosis not present

## 2020-07-13 DIAGNOSIS — I2510 Atherosclerotic heart disease of native coronary artery without angina pectoris: Secondary | ICD-10-CM | POA: Diagnosis not present

## 2020-07-16 DIAGNOSIS — I5022 Chronic systolic (congestive) heart failure: Secondary | ICD-10-CM | POA: Diagnosis not present

## 2020-07-16 DIAGNOSIS — I10 Essential (primary) hypertension: Secondary | ICD-10-CM | POA: Diagnosis not present

## 2020-07-16 DIAGNOSIS — I25118 Atherosclerotic heart disease of native coronary artery with other forms of angina pectoris: Secondary | ICD-10-CM | POA: Diagnosis not present

## 2020-07-16 DIAGNOSIS — I6523 Occlusion and stenosis of bilateral carotid arteries: Secondary | ICD-10-CM | POA: Diagnosis not present

## 2020-07-24 DIAGNOSIS — I5022 Chronic systolic (congestive) heart failure: Secondary | ICD-10-CM | POA: Diagnosis not present

## 2020-07-24 DIAGNOSIS — I10 Essential (primary) hypertension: Secondary | ICD-10-CM | POA: Diagnosis not present

## 2020-07-24 DIAGNOSIS — F319 Bipolar disorder, unspecified: Secondary | ICD-10-CM | POA: Diagnosis not present

## 2020-07-24 DIAGNOSIS — N1832 Chronic kidney disease, stage 3b: Secondary | ICD-10-CM | POA: Diagnosis not present

## 2020-07-24 DIAGNOSIS — R7303 Prediabetes: Secondary | ICD-10-CM | POA: Diagnosis not present

## 2020-07-24 DIAGNOSIS — I700 Atherosclerosis of aorta: Secondary | ICD-10-CM | POA: Diagnosis not present

## 2020-07-24 DIAGNOSIS — I25118 Atherosclerotic heart disease of native coronary artery with other forms of angina pectoris: Secondary | ICD-10-CM | POA: Diagnosis not present

## 2020-07-24 DIAGNOSIS — T466X5A Adverse effect of antihyperlipidemic and antiarteriosclerotic drugs, initial encounter: Secondary | ICD-10-CM | POA: Diagnosis not present

## 2020-07-24 DIAGNOSIS — I6523 Occlusion and stenosis of bilateral carotid arteries: Secondary | ICD-10-CM | POA: Diagnosis not present

## 2020-07-24 DIAGNOSIS — M791 Myalgia, unspecified site: Secondary | ICD-10-CM | POA: Diagnosis not present

## 2020-07-27 DIAGNOSIS — I10 Essential (primary) hypertension: Secondary | ICD-10-CM | POA: Diagnosis not present

## 2020-07-27 DIAGNOSIS — I447 Left bundle-branch block, unspecified: Secondary | ICD-10-CM | POA: Diagnosis not present

## 2020-07-27 DIAGNOSIS — I651 Occlusion and stenosis of basilar artery: Secondary | ICD-10-CM | POA: Diagnosis not present

## 2020-07-27 DIAGNOSIS — I2510 Atherosclerotic heart disease of native coronary artery without angina pectoris: Secondary | ICD-10-CM | POA: Diagnosis not present

## 2020-07-27 DIAGNOSIS — I6523 Occlusion and stenosis of bilateral carotid arteries: Secondary | ICD-10-CM | POA: Diagnosis not present

## 2020-08-04 ENCOUNTER — Other Ambulatory Visit: Payer: Self-pay | Admitting: Gastroenterology

## 2020-08-04 DIAGNOSIS — R1032 Left lower quadrant pain: Secondary | ICD-10-CM

## 2020-08-04 DIAGNOSIS — K5904 Chronic idiopathic constipation: Secondary | ICD-10-CM

## 2020-08-04 DIAGNOSIS — Q438 Other specified congenital malformations of intestine: Secondary | ICD-10-CM

## 2020-08-04 DIAGNOSIS — R140 Abdominal distension (gaseous): Secondary | ICD-10-CM

## 2020-08-07 ENCOUNTER — Ambulatory Visit
Admission: RE | Admit: 2020-08-07 | Discharge: 2020-08-07 | Disposition: A | Discharge status: Home / Self Care | Payer: PPO | Source: Ambulatory Visit | Attending: Gastroenterology | Admitting: Gastroenterology

## 2020-08-07 ENCOUNTER — Other Ambulatory Visit: Payer: Self-pay

## 2020-08-07 DIAGNOSIS — Q438 Other specified congenital malformations of intestine: Secondary | ICD-10-CM | POA: Diagnosis not present

## 2020-08-07 DIAGNOSIS — K5904 Chronic idiopathic constipation: Secondary | ICD-10-CM | POA: Insufficient documentation

## 2020-08-07 DIAGNOSIS — I700 Atherosclerosis of aorta: Secondary | ICD-10-CM | POA: Diagnosis not present

## 2020-08-07 DIAGNOSIS — K3189 Other diseases of stomach and duodenum: Secondary | ICD-10-CM | POA: Diagnosis not present

## 2020-08-07 DIAGNOSIS — R140 Abdominal distension (gaseous): Secondary | ICD-10-CM | POA: Diagnosis not present

## 2020-08-07 DIAGNOSIS — R1032 Left lower quadrant pain: Secondary | ICD-10-CM | POA: Insufficient documentation

## 2020-08-07 DIAGNOSIS — N200 Calculus of kidney: Secondary | ICD-10-CM | POA: Diagnosis not present

## 2020-08-07 DIAGNOSIS — K449 Diaphragmatic hernia without obstruction or gangrene: Secondary | ICD-10-CM | POA: Diagnosis not present

## 2020-08-07 MED ORDER — IOHEXOL 350 MG/ML SOLN
80.0000 mL | Freq: Once | INTRAVENOUS | Status: AC | PRN
  Administered 2020-08-07: 80 mL via INTRAVENOUS

## 2020-08-20 ENCOUNTER — Other Ambulatory Visit: Payer: Self-pay | Admitting: Internal Medicine

## 2020-08-20 DIAGNOSIS — R911 Solitary pulmonary nodule: Secondary | ICD-10-CM

## 2020-09-09 ENCOUNTER — Ambulatory Visit
Admission: RE | Admit: 2020-09-09 | Discharge: 2020-09-09 | Disposition: A | Discharge status: Home / Self Care | Payer: PPO | Source: Ambulatory Visit | Attending: Internal Medicine | Admitting: Internal Medicine

## 2020-09-09 ENCOUNTER — Other Ambulatory Visit: Payer: Self-pay

## 2020-09-09 DIAGNOSIS — R911 Solitary pulmonary nodule: Secondary | ICD-10-CM | POA: Diagnosis not present

## 2020-09-09 DIAGNOSIS — I700 Atherosclerosis of aorta: Secondary | ICD-10-CM | POA: Diagnosis not present

## 2020-09-15 DIAGNOSIS — Z7982 Long term (current) use of aspirin: Secondary | ICD-10-CM | POA: Diagnosis not present

## 2020-09-15 DIAGNOSIS — Z6827 Body mass index (BMI) 27.0-27.9, adult: Secondary | ICD-10-CM | POA: Diagnosis not present

## 2020-09-15 DIAGNOSIS — I25118 Atherosclerotic heart disease of native coronary artery with other forms of angina pectoris: Secondary | ICD-10-CM | POA: Diagnosis not present

## 2020-09-15 DIAGNOSIS — I5020 Unspecified systolic (congestive) heart failure: Secondary | ICD-10-CM | POA: Diagnosis not present

## 2020-09-15 DIAGNOSIS — I110 Hypertensive heart disease with heart failure: Secondary | ICD-10-CM | POA: Diagnosis not present

## 2020-09-30 DIAGNOSIS — H353231 Exudative age-related macular degeneration, bilateral, with active choroidal neovascularization: Secondary | ICD-10-CM | POA: Diagnosis not present

## 2020-10-28 DIAGNOSIS — M5416 Radiculopathy, lumbar region: Secondary | ICD-10-CM | POA: Diagnosis not present

## 2020-10-28 DIAGNOSIS — M48062 Spinal stenosis, lumbar region with neurogenic claudication: Secondary | ICD-10-CM | POA: Diagnosis not present

## 2020-11-09 ENCOUNTER — Ambulatory Visit
Admission: RE | Admit: 2020-11-09 | Discharge: 2020-11-09 | Disposition: A | Discharge status: Home / Self Care | Payer: PPO | Source: Ambulatory Visit | Attending: Physician Assistant | Admitting: Physician Assistant

## 2020-11-09 ENCOUNTER — Other Ambulatory Visit: Payer: Self-pay | Admitting: Physician Assistant

## 2020-11-09 ENCOUNTER — Other Ambulatory Visit: Payer: Self-pay

## 2020-11-09 DIAGNOSIS — M7989 Other specified soft tissue disorders: Secondary | ICD-10-CM

## 2020-11-18 DIAGNOSIS — M7062 Trochanteric bursitis, left hip: Secondary | ICD-10-CM | POA: Diagnosis not present

## 2020-11-18 DIAGNOSIS — M48062 Spinal stenosis, lumbar region with neurogenic claudication: Secondary | ICD-10-CM | POA: Diagnosis not present

## 2020-11-18 DIAGNOSIS — M7061 Trochanteric bursitis, right hip: Secondary | ICD-10-CM | POA: Diagnosis not present

## 2020-11-18 DIAGNOSIS — M5416 Radiculopathy, lumbar region: Secondary | ICD-10-CM | POA: Diagnosis not present

## 2020-11-18 DIAGNOSIS — Z79899 Other long term (current) drug therapy: Secondary | ICD-10-CM | POA: Diagnosis not present

## 2020-11-18 DIAGNOSIS — M5136 Other intervertebral disc degeneration, lumbar region: Secondary | ICD-10-CM | POA: Diagnosis not present

## 2020-12-11 ENCOUNTER — Other Ambulatory Visit: Payer: Self-pay | Admitting: Internal Medicine

## 2020-12-11 DIAGNOSIS — Z1231 Encounter for screening mammogram for malignant neoplasm of breast: Secondary | ICD-10-CM

## 2020-12-15 ENCOUNTER — Other Ambulatory Visit: Payer: Self-pay

## 2020-12-15 ENCOUNTER — Ambulatory Visit (INDEPENDENT_AMBULATORY_CARE_PROVIDER_SITE_OTHER): Payer: PPO | Admitting: Dermatology

## 2020-12-15 DIAGNOSIS — I5020 Unspecified systolic (congestive) heart failure: Secondary | ICD-10-CM | POA: Diagnosis not present

## 2020-12-15 DIAGNOSIS — Z5321 Procedure and treatment not carried out due to patient leaving prior to being seen by health care provider: Secondary | ICD-10-CM

## 2020-12-15 DIAGNOSIS — Z7982 Long term (current) use of aspirin: Secondary | ICD-10-CM | POA: Diagnosis not present

## 2020-12-15 DIAGNOSIS — I25118 Atherosclerotic heart disease of native coronary artery with other forms of angina pectoris: Secondary | ICD-10-CM | POA: Diagnosis not present

## 2020-12-15 DIAGNOSIS — I110 Hypertensive heart disease with heart failure: Secondary | ICD-10-CM | POA: Diagnosis not present

## 2020-12-29 NOTE — Progress Notes (Signed)
Error, pt not seen.

## 2020-12-30 DIAGNOSIS — H353211 Exudative age-related macular degeneration, right eye, with active choroidal neovascularization: Secondary | ICD-10-CM | POA: Diagnosis not present

## 2020-12-30 DIAGNOSIS — H353221 Exudative age-related macular degeneration, left eye, with active choroidal neovascularization: Secondary | ICD-10-CM | POA: Diagnosis not present

## 2021-01-01 DIAGNOSIS — Q438 Other specified congenital malformations of intestine: Secondary | ICD-10-CM | POA: Diagnosis not present

## 2021-01-01 DIAGNOSIS — K219 Gastro-esophageal reflux disease without esophagitis: Secondary | ICD-10-CM | POA: Diagnosis not present

## 2021-01-01 DIAGNOSIS — K5904 Chronic idiopathic constipation: Secondary | ICD-10-CM | POA: Diagnosis not present

## 2021-01-18 DIAGNOSIS — N1832 Chronic kidney disease, stage 3b: Secondary | ICD-10-CM | POA: Diagnosis not present

## 2021-01-18 DIAGNOSIS — R7303 Prediabetes: Secondary | ICD-10-CM | POA: Diagnosis not present

## 2021-01-18 DIAGNOSIS — I5022 Chronic systolic (congestive) heart failure: Secondary | ICD-10-CM | POA: Diagnosis not present

## 2021-01-25 DIAGNOSIS — Z9989 Dependence on other enabling machines and devices: Secondary | ICD-10-CM | POA: Diagnosis not present

## 2021-01-25 DIAGNOSIS — I2510 Atherosclerotic heart disease of native coronary artery without angina pectoris: Secondary | ICD-10-CM | POA: Diagnosis not present

## 2021-01-25 DIAGNOSIS — I6523 Occlusion and stenosis of bilateral carotid arteries: Secondary | ICD-10-CM | POA: Diagnosis not present

## 2021-01-25 DIAGNOSIS — I10 Essential (primary) hypertension: Secondary | ICD-10-CM | POA: Diagnosis not present

## 2021-01-25 DIAGNOSIS — N1832 Chronic kidney disease, stage 3b: Secondary | ICD-10-CM | POA: Diagnosis not present

## 2021-01-25 DIAGNOSIS — M791 Myalgia, unspecified site: Secondary | ICD-10-CM | POA: Diagnosis not present

## 2021-01-25 DIAGNOSIS — R7303 Prediabetes: Secondary | ICD-10-CM | POA: Diagnosis not present

## 2021-01-25 DIAGNOSIS — I700 Atherosclerosis of aorta: Secondary | ICD-10-CM | POA: Diagnosis not present

## 2021-01-25 DIAGNOSIS — Z0000 Encounter for general adult medical examination without abnormal findings: Secondary | ICD-10-CM | POA: Diagnosis not present

## 2021-01-25 DIAGNOSIS — R413 Other amnesia: Secondary | ICD-10-CM | POA: Diagnosis not present

## 2021-01-25 DIAGNOSIS — G3184 Mild cognitive impairment, so stated: Secondary | ICD-10-CM | POA: Diagnosis not present

## 2021-01-25 DIAGNOSIS — F0390 Unspecified dementia without behavioral disturbance: Secondary | ICD-10-CM | POA: Diagnosis present

## 2021-01-25 DIAGNOSIS — F319 Bipolar disorder, unspecified: Secondary | ICD-10-CM | POA: Diagnosis not present

## 2021-01-29 ENCOUNTER — Other Ambulatory Visit: Payer: Self-pay

## 2021-01-29 ENCOUNTER — Ambulatory Visit
Admission: RE | Admit: 2021-01-29 | Discharge: 2021-01-29 | Disposition: A | Discharge status: Home / Self Care | Payer: PPO | Source: Ambulatory Visit | Attending: Internal Medicine | Admitting: Internal Medicine

## 2021-01-29 DIAGNOSIS — Z1231 Encounter for screening mammogram for malignant neoplasm of breast: Secondary | ICD-10-CM | POA: Insufficient documentation

## 2021-03-15 DIAGNOSIS — H9202 Otalgia, left ear: Secondary | ICD-10-CM | POA: Diagnosis not present

## 2021-03-26 DIAGNOSIS — M48062 Spinal stenosis, lumbar region with neurogenic claudication: Secondary | ICD-10-CM | POA: Diagnosis not present

## 2021-03-26 DIAGNOSIS — M7061 Trochanteric bursitis, right hip: Secondary | ICD-10-CM | POA: Diagnosis not present

## 2021-03-26 DIAGNOSIS — M5136 Other intervertebral disc degeneration, lumbar region: Secondary | ICD-10-CM | POA: Diagnosis not present

## 2021-03-26 DIAGNOSIS — M7062 Trochanteric bursitis, left hip: Secondary | ICD-10-CM | POA: Diagnosis not present

## 2021-03-26 DIAGNOSIS — M5416 Radiculopathy, lumbar region: Secondary | ICD-10-CM | POA: Diagnosis not present

## 2021-04-01 DIAGNOSIS — H9203 Otalgia, bilateral: Secondary | ICD-10-CM | POA: Diagnosis not present

## 2021-04-01 DIAGNOSIS — M542 Cervicalgia: Secondary | ICD-10-CM | POA: Diagnosis not present

## 2021-04-01 DIAGNOSIS — H6982 Other specified disorders of Eustachian tube, left ear: Secondary | ICD-10-CM | POA: Diagnosis not present

## 2021-04-01 DIAGNOSIS — H903 Sensorineural hearing loss, bilateral: Secondary | ICD-10-CM | POA: Diagnosis not present

## 2021-04-06 DIAGNOSIS — N1832 Chronic kidney disease, stage 3b: Secondary | ICD-10-CM | POA: Diagnosis not present

## 2021-04-06 DIAGNOSIS — F319 Bipolar disorder, unspecified: Secondary | ICD-10-CM | POA: Diagnosis not present

## 2021-04-06 DIAGNOSIS — I6523 Occlusion and stenosis of bilateral carotid arteries: Secondary | ICD-10-CM | POA: Diagnosis not present

## 2021-04-06 DIAGNOSIS — M791 Myalgia, unspecified site: Secondary | ICD-10-CM | POA: Diagnosis not present

## 2021-04-06 DIAGNOSIS — R7303 Prediabetes: Secondary | ICD-10-CM | POA: Diagnosis not present

## 2021-04-06 DIAGNOSIS — F0390 Unspecified dementia without behavioral disturbance: Secondary | ICD-10-CM | POA: Diagnosis not present

## 2021-04-06 DIAGNOSIS — I2510 Atherosclerotic heart disease of native coronary artery without angina pectoris: Secondary | ICD-10-CM | POA: Diagnosis not present

## 2021-04-06 DIAGNOSIS — T466X5A Adverse effect of antihyperlipidemic and antiarteriosclerotic drugs, initial encounter: Secondary | ICD-10-CM | POA: Diagnosis not present

## 2021-04-06 DIAGNOSIS — I10 Essential (primary) hypertension: Secondary | ICD-10-CM | POA: Diagnosis not present

## 2021-04-07 DIAGNOSIS — H353231 Exudative age-related macular degeneration, bilateral, with active choroidal neovascularization: Secondary | ICD-10-CM | POA: Diagnosis not present

## 2021-04-07 DIAGNOSIS — H353211 Exudative age-related macular degeneration, right eye, with active choroidal neovascularization: Secondary | ICD-10-CM | POA: Diagnosis not present

## 2021-04-07 DIAGNOSIS — H353221 Exudative age-related macular degeneration, left eye, with active choroidal neovascularization: Secondary | ICD-10-CM | POA: Diagnosis not present

## 2021-04-14 DIAGNOSIS — R110 Nausea: Secondary | ICD-10-CM | POA: Diagnosis not present

## 2021-04-14 DIAGNOSIS — Z981 Arthrodesis status: Secondary | ICD-10-CM | POA: Diagnosis not present

## 2021-04-14 DIAGNOSIS — R059 Cough, unspecified: Secondary | ICD-10-CM | POA: Diagnosis not present

## 2021-04-14 DIAGNOSIS — R058 Other specified cough: Secondary | ICD-10-CM | POA: Diagnosis not present

## 2021-04-20 DIAGNOSIS — I2510 Atherosclerotic heart disease of native coronary artery without angina pectoris: Secondary | ICD-10-CM | POA: Diagnosis not present

## 2021-04-20 DIAGNOSIS — I447 Left bundle-branch block, unspecified: Secondary | ICD-10-CM | POA: Diagnosis not present

## 2021-04-20 DIAGNOSIS — I10 Essential (primary) hypertension: Secondary | ICD-10-CM | POA: Diagnosis not present

## 2021-04-20 DIAGNOSIS — N1832 Chronic kidney disease, stage 3b: Secondary | ICD-10-CM | POA: Diagnosis not present

## 2021-04-20 DIAGNOSIS — I38 Endocarditis, valve unspecified: Secondary | ICD-10-CM | POA: Diagnosis not present

## 2021-04-20 DIAGNOSIS — I6523 Occlusion and stenosis of bilateral carotid arteries: Secondary | ICD-10-CM | POA: Diagnosis not present

## 2021-05-14 DIAGNOSIS — Z79899 Other long term (current) drug therapy: Secondary | ICD-10-CM | POA: Diagnosis not present

## 2021-05-14 DIAGNOSIS — M5126 Other intervertebral disc displacement, lumbar region: Secondary | ICD-10-CM | POA: Diagnosis not present

## 2021-05-14 DIAGNOSIS — M5416 Radiculopathy, lumbar region: Secondary | ICD-10-CM | POA: Diagnosis not present

## 2021-05-14 DIAGNOSIS — M5136 Other intervertebral disc degeneration, lumbar region: Secondary | ICD-10-CM | POA: Diagnosis not present

## 2021-05-14 DIAGNOSIS — M48062 Spinal stenosis, lumbar region with neurogenic claudication: Secondary | ICD-10-CM | POA: Diagnosis not present

## 2021-05-21 DIAGNOSIS — F3130 Bipolar disorder, current episode depressed, mild or moderate severity, unspecified: Secondary | ICD-10-CM | POA: Diagnosis not present

## 2021-05-21 DIAGNOSIS — D692 Other nonthrombocytopenic purpura: Secondary | ICD-10-CM | POA: Diagnosis not present

## 2021-05-21 DIAGNOSIS — H353290 Exudative age-related macular degeneration, unspecified eye, stage unspecified: Secondary | ICD-10-CM | POA: Diagnosis not present

## 2021-05-21 DIAGNOSIS — I700 Atherosclerosis of aorta: Secondary | ICD-10-CM | POA: Diagnosis not present

## 2021-05-21 DIAGNOSIS — I130 Hypertensive heart and chronic kidney disease with heart failure and stage 1 through stage 4 chronic kidney disease, or unspecified chronic kidney disease: Secondary | ICD-10-CM | POA: Diagnosis not present

## 2021-05-21 DIAGNOSIS — N183 Chronic kidney disease, stage 3 unspecified: Secondary | ICD-10-CM | POA: Diagnosis not present

## 2021-05-21 DIAGNOSIS — I25118 Atherosclerotic heart disease of native coronary artery with other forms of angina pectoris: Secondary | ICD-10-CM | POA: Diagnosis not present

## 2021-05-21 DIAGNOSIS — Z6825 Body mass index (BMI) 25.0-25.9, adult: Secondary | ICD-10-CM | POA: Diagnosis not present

## 2021-05-21 DIAGNOSIS — Z7982 Long term (current) use of aspirin: Secondary | ICD-10-CM | POA: Diagnosis not present

## 2021-05-21 DIAGNOSIS — I5020 Unspecified systolic (congestive) heart failure: Secondary | ICD-10-CM | POA: Diagnosis not present

## 2021-06-02 DIAGNOSIS — M48062 Spinal stenosis, lumbar region with neurogenic claudication: Secondary | ICD-10-CM | POA: Diagnosis not present

## 2021-06-02 DIAGNOSIS — M5416 Radiculopathy, lumbar region: Secondary | ICD-10-CM | POA: Diagnosis not present

## 2021-06-21 DIAGNOSIS — M542 Cervicalgia: Secondary | ICD-10-CM | POA: Diagnosis not present

## 2021-06-21 DIAGNOSIS — R21 Rash and other nonspecific skin eruption: Secondary | ICD-10-CM | POA: Diagnosis not present

## 2021-06-23 DIAGNOSIS — M5136 Other intervertebral disc degeneration, lumbar region: Secondary | ICD-10-CM | POA: Diagnosis not present

## 2021-06-23 DIAGNOSIS — M5416 Radiculopathy, lumbar region: Secondary | ICD-10-CM | POA: Diagnosis not present

## 2021-06-23 DIAGNOSIS — M48062 Spinal stenosis, lumbar region with neurogenic claudication: Secondary | ICD-10-CM | POA: Diagnosis not present

## 2021-06-23 DIAGNOSIS — M5126 Other intervertebral disc displacement, lumbar region: Secondary | ICD-10-CM | POA: Diagnosis not present

## 2021-07-14 DIAGNOSIS — H353231 Exudative age-related macular degeneration, bilateral, with active choroidal neovascularization: Secondary | ICD-10-CM | POA: Diagnosis not present

## 2021-07-20 DIAGNOSIS — R7303 Prediabetes: Secondary | ICD-10-CM | POA: Diagnosis not present

## 2021-07-20 DIAGNOSIS — N1832 Chronic kidney disease, stage 3b: Secondary | ICD-10-CM | POA: Diagnosis not present

## 2021-07-20 DIAGNOSIS — I2510 Atherosclerotic heart disease of native coronary artery without angina pectoris: Secondary | ICD-10-CM | POA: Diagnosis not present

## 2021-07-26 DIAGNOSIS — N1832 Chronic kidney disease, stage 3b: Secondary | ICD-10-CM | POA: Diagnosis not present

## 2021-07-26 DIAGNOSIS — F0390 Unspecified dementia without behavioral disturbance: Secondary | ICD-10-CM | POA: Diagnosis not present

## 2021-07-26 DIAGNOSIS — I2510 Atherosclerotic heart disease of native coronary artery without angina pectoris: Secondary | ICD-10-CM | POA: Diagnosis not present

## 2021-07-26 DIAGNOSIS — R7303 Prediabetes: Secondary | ICD-10-CM | POA: Diagnosis not present

## 2021-07-26 DIAGNOSIS — F319 Bipolar disorder, unspecified: Secondary | ICD-10-CM | POA: Diagnosis not present

## 2021-07-26 DIAGNOSIS — I6523 Occlusion and stenosis of bilateral carotid arteries: Secondary | ICD-10-CM | POA: Diagnosis not present

## 2021-08-04 ENCOUNTER — Encounter: Payer: Self-pay | Admitting: Dermatology

## 2021-08-04 ENCOUNTER — Ambulatory Visit: Payer: PPO | Admitting: Dermatology

## 2021-08-04 DIAGNOSIS — Z1283 Encounter for screening for malignant neoplasm of skin: Secondary | ICD-10-CM

## 2021-08-04 DIAGNOSIS — D1800 Hemangioma unspecified site: Secondary | ICD-10-CM

## 2021-08-04 DIAGNOSIS — D229 Melanocytic nevi, unspecified: Secondary | ICD-10-CM

## 2021-08-04 DIAGNOSIS — L820 Inflamed seborrheic keratosis: Secondary | ICD-10-CM | POA: Diagnosis not present

## 2021-08-04 DIAGNOSIS — Z85828 Personal history of other malignant neoplasm of skin: Secondary | ICD-10-CM | POA: Diagnosis not present

## 2021-08-04 DIAGNOSIS — Z86018 Personal history of other benign neoplasm: Secondary | ICD-10-CM | POA: Diagnosis not present

## 2021-08-04 DIAGNOSIS — L300 Nummular dermatitis: Secondary | ICD-10-CM

## 2021-08-04 DIAGNOSIS — D692 Other nonthrombocytopenic purpura: Secondary | ICD-10-CM

## 2021-08-04 DIAGNOSIS — L814 Other melanin hyperpigmentation: Secondary | ICD-10-CM

## 2021-08-04 DIAGNOSIS — L578 Other skin changes due to chronic exposure to nonionizing radiation: Secondary | ICD-10-CM | POA: Diagnosis not present

## 2021-08-04 DIAGNOSIS — L821 Other seborrheic keratosis: Secondary | ICD-10-CM

## 2021-08-04 MED ORDER — CLOBETASOL PROPIONATE 0.05 % EX SOLN: CUTANEOUS | 2 refills | Status: AC

## 2021-08-04 NOTE — Patient Instructions (Addendum)
Eczema Skin Care  Buy TWO 16oz jars of CeraVe moisturizing cream  [facility], [facility], [facility] (no prescription needed)  Costs about $15 per jar   Jar #1: Use as a moisturizer as needed. Can be applied to any area of the body. Use twice daily to unaffected areas.  Jar #2: Pour one 52m bottle of clobetasol 0.05% solution into jar, mix well. Label this jar to indicate the medication has been added. Use twice daily to affected areas. Do not apply to face, groin or underarms.  Moisturizer may burn or sting initially. Try for at least 4 weeks.     Avoid applying to face, groin, and axilla. Use as directed. Long-term use can cause thinning of the skin.   Topical steroids (such as triamcinolone, fluocinolone, fluocinonide, mometasone, clobetasol, halobetasol, betamethasone, hydrocortisone) can cause thinning and lightening of the skin if they are used for too long in the same area. Your physician has selected the right strength medicine for your problem and area affected on the body. Please use your medication only as directed by your physician to prevent side effects.       Gentle Skin Care Guide  1. Bathe no more than once a day.  2. Avoid bathing in hot water  3. Use a mild soap like Dove for Sensitive Skin, Vanicream, Cetaphil, CeraVe. Can use Lever 2000 or Cetaphil antibacterial soap  4. Use soap only where you need it. On most days, use it under your arms, between your legs, and on your feet. Let the water rinse other areas unless visibly dirty.  5. When you get out of the bath/shower, use a towel to gently blot your skin dry, don't rub it.  6. While your skin is still a little damp, apply a moisturizing cream such as Vanicream, CeraVe, Cetaphil, Eucerin, Sarna lotion or plain Vaseline Jelly. For hands apply Neutrogena [place]Hand Cream or Excipial Hand Cream.  7. Reapply moisturizer any time you start to itch or feel dry.  8. Sometimes using free and clear laundry detergents can  be helpful. Fabric softener sheets should be avoided. Downy Free & Gentle liquid, or any liquid fabric softener that is free of dyes and perfumes, it acceptable to use  9. If your doctor has given you prescription creams you may apply moisturizers over them    Due to recent changes in healthcare laws, you may see results of your pathology and/or laboratory studies on MyChart before the doctors have had a chance to review them. We understand that in some cases there may be results that are confusing or concerning to you. Please understand that not all results are received at the same time and often the doctors may need to interpret multiple results in order to provide you with the best plan of care or course of treatment. Therefore, we ask that you please give u[place]2 business days to thoroughly review all your results before contacting the office for clarification. Should we see a critical lab result, you will be contacted sooner.   If You Need Anything After Your Visit  If you have any questions or concerns for your doctor, please call our main line at [phone] press option 4 to reach your doctor's medical assistant. If no one answers, please leave a voicemail as directed and we will return your call as soon as possible. Messages left after 4 pm will be answered the following business day.   You may also send [place] message via [facility] We typically respond  to MyChart messages within 1-2 business days.  For prescription refills, please ask your pharmacy to contact our office. Our fax number is [phone].  If you have an urgent issue when the clinic is closed that cannot wait until the next business day, you can page your doctor at the number below.    Please note that while we do our best to be available for urgent issues outside of office hours, we are not available 24/7.   If you have an urgent issue and are unable to reach [place], you may choose to seek medical care at your doctor's office,  retail clinic, urgent care center, or emergency room.  If you have a medical emergency, please immediately call 911 or go to the emergency department.  Pager Numbers  - Dr. Nehemiah Massed: [phone]  - Dr. Laurence Ferrari: [phone]  - Dr. Nicole Kindred: [phone]  In the event of inclement weather, please call our main line at [phone] for an update on the status of any delays or closures.  Dermatology Medication Tips: Please keep the boxes that topical medications come in in order to help keep track of the instructions about where and how to use these. Pharmacies typically print the medication instructions only on the boxes and not directly on the medication tubes.   If your medication is too expensive, please contact our office at [phone] option 4 or send [place] a message through [place].   We are unable to tell what your co-pay for medications will be in advance as this is different depending on your insurance coverage. However, we may be able to find a substitute medication at lower cost or fill out paperwork to get insurance to cover a needed medication.   If a prior authorization is required to get your medication covered by your insurance company, please allow [place] 1-2 business days to complete this process.  Drug prices often vary depending on where the prescription is filled and some pharmacies may offer cheaper prices.  The website www.goodrx.com contains coupons for medications through different pharmacies. The prices here do not account for what the cost may be with help from insurance (it may be cheaper with your insurance), but the website can give you the price if you did not use any insurance.  - You can print the associated coupon and take it with your prescription to the pharmacy.  - You may also stop by our office during regular business hours and pick up a GoodRx coupon card.  - If you need your prescription sent electronically to a different pharmacy, notify our office through  [facility] or by phone at [phone] option 4.     Si Usted Necesita Algo Despus de Su Visita  Tambin puede enviarnos un mensaje a travs de Pharmacist, community. Por lo general respondemos a los mensajes de MyChart en el transcurso de 1 a 2 das hbiles.  Para renovar recetas, por favor pida a su farmacia que se ponga en contacto con nuestra oficina. Harland Dingwall de fax es [place] [phone].  Si tiene un asunto urgente cuando la clnica est cerrada y que no puede esperar hasta el siguiente da hbil, puede llamar/localizar a su doctor(a) al nmero que aparece a continuacin.   Por favor, tenga en cuenta que aunque hacemos todo lo posible para estar disponibles para asuntos urgentes fuera del horario de [place], no estamos disponibles las 24 horas del da, los 7 das de la [place].   Si tiene un problema urgente y no puede comunicarse con nosotros,  puede optar por buscar atencin mdica  en el consultorio de su doctor(a), en una clnica privada, en un centro de atencin urgente o en una sala de emergencias.  Si tiene Engineering geologist, por favor llame inmediatamente al 911 o vaya a la sala de emergencias.  Nmeros de bper  - Dr. Nehemiah Massed: [phone]  - Dra. Moye: [phone]  - Dra. Nicole Kindred: [phone]  En caso de inclemencias del [place], por favor llame a Johnsie Kindred principal al [phone] para una actualizacin sobre el [place] de cualquier retraso o cierre.  Consejos para la medicacin en dermatologa: Por favor, guarde las cajas en las que vienen los medicamentos de uso tpico para ayudarle a seguir las instrucciones sobre dnde y cmo usarlos. Las farmacias generalmente imprimen las instrucciones del medicamento slo en las cajas y no directamente en los tubos del [place].   Si su medicamento es muy caro, por favor, pngase en contacto con Zigmund Daniel llamando al [phone] y presione la opcin 4 o envenos un mensaje a travs de Pharmacist, community.   No podemos  decirle cul ser su copago por los medicamentos por adelantado ya que esto es diferente dependiendo de la cobertura de su seguro. Sin embargo, es posible que podamos encontrar un medicamento sustituto a Electrical engineer un formulario para que el seguro cubra el medicamento que se considera necesario.   Si se requiere una autorizacin previa para que su compaa de seguros [place] su medicamento, por favor permtanos de 1 a 2 das hbiles para completar este proceso.  Los precios de los medicamentos varan con frecuencia dependiendo del Environmental consultant de dnde se surte la receta y alguna farmacias pueden ofrecer precios ms baratos.  El sitio web www.goodrx.com tiene cupones para medicamentos de Airline pilot. Los precios aqu no tienen en cuenta lo que podra costar con la ayuda del seguro (puede ser ms barato con su seguro), pero el sitio web puede darle el precio si no utiliz Research scientist (physical sciences).  - Puede imprimir el cupn correspondiente y llevarlo con su receta a la farmacia.  - Tambin puede pasar por nuestra oficina durante el horario de atencin regular y Charity fundraiser una tarjeta de cupones de GoodRx.  - Si necesita que su receta se enve electrnicamente a una farmacia diferente, informe a nuestra oficina a travs de MyChart de [place] o por telfono llamando al [phone] y presione la opcin 4.

## 2021-08-04 NOTE — Progress Notes (Signed)
Follow-Up Visit   Subjective  Madeline Cabrera is a 76 y.o. female who presents for the following: Annual Exam (Upper body exam. Has several areas of concern. Raised, itching.  Hx of BCC, HxDN, with severe atypia).  She has had itching all over body for over 6 weeks.  The patient presents for Upper Body Skin Exam (UBSE) for skin cancer screening and mole check.  The patient has spots, moles and lesions to be evaluated, some may be new or changing and the patient has concerns that these could be cancer.   The following portions of the chart were reviewed this encounter and updated as appropriate:      Review of Systems: No other skin or systemic complaints except as noted in HPI or Assessment and Plan.   Objective  Well appearing patient in no apparent distress; mood and affect are within normal limits.  All skin waist up examined.  legs, arms, back Scattered pink excoriated papules at legs, back, arms   left upper forearm Pink slightly waxy firm papule   Assessment & Plan   History of Basal Cell Carcinoma of the Skin. Left upper lip below nasolabial. Excised 10/03/2018. - No evidence of recurrence today - Recommend regular full body skin exams - Recommend daily broad spectrum sunscreen SPF 30+ to sun-exposed areas, reapply every 2 hours as needed.  - Call if any new or changing lesions are noted between office visits   History of Dysplastic Nevus. Left medial shoulder, severe. 03/12/2018. - No evidence of recurrence today - Recommend regular full body skin exams - Recommend daily broad spectrum sunscreen SPF 30+ to sun-exposed areas, reapply every 2 hours as needed.  - Call if any new or changing lesions are noted between office visits   Lentigines - Scattered tan macules - Due to sun exposure - Benign-appearing, observe - Recommend daily broad spectrum sunscreen SPF 30+ to sun-exposed areas, reapply every 2 hours as needed. - Call for any changes  Seborrheic  Keratoses - Stuck-on, waxy, tan-brown papules and/or plaques  - Benign-appearing - Discussed benign etiology and prognosis. - Observe - Call for any changes  Melanocytic Nevi - Tan-brown and/or pink-flesh-colored symmetric macules and papules - Benign appearing on exam today - Observation - Call clinic for new or changing moles - Recommend daily use of broad spectrum spf 30+ sunscreen to sun-exposed areas.   Hemangiomas - Red papules - Discussed benign nature - Observe - Call for any changes  Actinic Damage - Chronic condition, secondary to cumulative UV/sun exposure - diffuse scaly erythematous macules with underlying dyspigmentation - Recommend daily broad spectrum sunscreen SPF 30+ to sun-exposed areas, reapply every 2 hours as needed.  - Staying in the shade or wearing long sleeves, sun glasses (UVA+UVB protection) and wide brim hats (4-inch brim around the entire circumference of the hat) are also recommended for sun protection.  - Call for new or changing lesions.  Skin cancer screening performed today.  Purpura - Chronic; persistent and recurrent.  Treatable, but not curable. - Violaceous macules and patches - Benign - Related to trauma, age, sun damage and/or use of blood thinners, chronic use of topical and/or oral steroids - Observe - Can use OTC arnica containing moisturizer such as Dermend Bruise Formula if desired - Call for worsening or other concerns   Nummular dermatitis legs, arms, back  With pruritus, likely due to dry skin Chronic and persistent condition with duration or expected duration over one year. Condition is bothersome/symptomatic for patient. Currently  flared.   Eczema Skin Care Recommend Dove sensitive skin care body wash Buy TWO 16oz jars of CeraVe moisturizing cream  [facility], [facility], [facility] (no prescription needed)  Costs about $15 per jar   Jar #1: Use as a moisturizer as needed. Can be applied to any area of the body. Use twice daily  to unaffected areas.  Jar #2: Pour one 30m bottle of clobetasol 0.05% solution into jar, mix well. Label this jar to indicate the medication has been added. Use twice daily to affected areas. Do not apply to face, groin or underarms.  Moisturizer may burn or sting initially. Try for at least 4 weeks.     Avoid applying to face, groin, and axilla. Use as directed. Long-term use can cause thinning of the skin.   Topical steroids (such as triamcinolone, fluocinolone, fluocinonide, mometasone, clobetasol, halobetasol, betamethasone, hydrocortisone) can cause thinning and lightening of the skin if they are used for too long in the same area. Your physician has selected the right strength medicine for your problem and area affected on the body. Please use your medication only as directed by your physician to prevent side effects.    clobetasol (TEMOVATE) 0.05 % external solution - legs, arms, back Apply twice daily to affected areas as needed for itching. Avoid applying to face, groin, and axilla.  Inflamed seborrheic keratosis left upper forearm  ISK vs healing excoriation   Recheck after treatment for nummular dermatitis. Use Clobetasol/CeraVe here twice daily.    Return in about 1 month (around 09/04/2021) for Rash Follow Up.  I, JEmelia Salisbury CMA, am acting as scribe for TBrendolyn Patty MD.  Documentation: I have reviewed the above documentation for accuracy and completeness, and I agree with the above.  TBrendolyn PattyMD

## 2021-08-09 ENCOUNTER — Telehealth: Payer: Self-pay

## 2021-08-09 NOTE — Telephone Encounter (Signed)
Pt called she throw away her rx for Clobetasol solution for her dermatitis she was suppose to mix with Cerave cream, because she read that Clobetasol solution may cause Glaucoma, pt would like something different for her dermatitis

## 2021-08-12 DIAGNOSIS — M7989 Other specified soft tissue disorders: Secondary | ICD-10-CM | POA: Diagnosis not present

## 2021-08-12 DIAGNOSIS — M79672 Pain in left foot: Secondary | ICD-10-CM | POA: Diagnosis not present

## 2021-08-12 DIAGNOSIS — M25572 Pain in left ankle and joints of left foot: Secondary | ICD-10-CM | POA: Diagnosis not present

## 2021-08-13 DIAGNOSIS — I130 Hypertensive heart and chronic kidney disease with heart failure and stage 1 through stage 4 chronic kidney disease, or unspecified chronic kidney disease: Secondary | ICD-10-CM | POA: Diagnosis not present

## 2021-08-13 DIAGNOSIS — H353290 Exudative age-related macular degeneration, unspecified eye, stage unspecified: Secondary | ICD-10-CM | POA: Diagnosis not present

## 2021-08-13 DIAGNOSIS — I700 Atherosclerosis of aorta: Secondary | ICD-10-CM | POA: Diagnosis not present

## 2021-08-13 DIAGNOSIS — S93602A Unspecified sprain of left foot, initial encounter: Secondary | ICD-10-CM | POA: Diagnosis not present

## 2021-08-13 DIAGNOSIS — I25118 Atherosclerotic heart disease of native coronary artery with other forms of angina pectoris: Secondary | ICD-10-CM | POA: Diagnosis not present

## 2021-08-13 DIAGNOSIS — F3131 Bipolar disorder, current episode depressed, mild: Secondary | ICD-10-CM | POA: Diagnosis not present

## 2021-08-13 DIAGNOSIS — I5020 Unspecified systolic (congestive) heart failure: Secondary | ICD-10-CM | POA: Diagnosis not present

## 2021-08-13 DIAGNOSIS — N1831 Chronic kidney disease, stage 3a: Secondary | ICD-10-CM | POA: Diagnosis not present

## 2021-08-13 DIAGNOSIS — Z6826 Body mass index (BMI) 26.0-26.9, adult: Secondary | ICD-10-CM | POA: Diagnosis not present

## 2021-08-13 DIAGNOSIS — Z7982 Long term (current) use of aspirin: Secondary | ICD-10-CM | POA: Diagnosis not present

## 2021-08-13 DIAGNOSIS — D692 Other nonthrombocytopenic purpura: Secondary | ICD-10-CM | POA: Diagnosis not present

## 2021-08-13 DIAGNOSIS — F0390 Unspecified dementia without behavioral disturbance: Secondary | ICD-10-CM | POA: Diagnosis not present

## 2021-09-07 DIAGNOSIS — M79672 Pain in left foot: Secondary | ICD-10-CM | POA: Diagnosis not present

## 2021-09-07 DIAGNOSIS — M7662 Achilles tendinitis, left leg: Secondary | ICD-10-CM | POA: Diagnosis not present

## 2021-09-07 DIAGNOSIS — N1832 Chronic kidney disease, stage 3b: Secondary | ICD-10-CM | POA: Diagnosis not present

## 2021-09-07 DIAGNOSIS — R7303 Prediabetes: Secondary | ICD-10-CM | POA: Diagnosis not present

## 2021-09-07 DIAGNOSIS — M5137 Other intervertebral disc degeneration, lumbosacral region: Secondary | ICD-10-CM | POA: Diagnosis not present

## 2021-09-07 DIAGNOSIS — M722 Plantar fascial fibromatosis: Secondary | ICD-10-CM | POA: Diagnosis not present

## 2021-09-13 ENCOUNTER — Ambulatory Visit: Payer: PPO | Admitting: Dermatology

## 2021-09-13 DIAGNOSIS — L300 Nummular dermatitis: Secondary | ICD-10-CM

## 2021-09-13 NOTE — Patient Instructions (Addendum)
Gentle Skin Care Guide  1. Bathe no more than once a day.  2. Avoid bathing in hot water  3. Use a mild soap like Dove, Vanicream, Cetaphil, CeraVe. Can use Lever 2000 or Cetaphil antibacterial soap  4. Use soap only where you need it. On most days, use it under your arms, between your legs, and on your feet. Let the water rinse other areas unless visibly dirty.  5. When you get out of the bath/shower, use a towel to gently blot your skin dry, don't rub it.  6. While your skin is still a little damp, apply a moisturizing cream such as Vanicream, CeraVe, Cetaphil, Eucerin, Sarna lotion or plain Vaseline Jelly. For hands apply Neutrogena Norwegian Hand Cream or Excipial Hand Cream.  7. Reapply moisturizer any time you start to itch or feel dry.  8. Sometimes using free and clear laundry detergents can be helpful. Fabric softener sheets should be avoided. Downy Free & Gentle liquid, or any liquid fabric softener that is free of dyes and perfumes, it acceptable to use  9. If your doctor has given you prescription creams you may apply moisturizers over them      Due to recent changes in healthcare laws, you may see results of your pathology and/or laboratory studies on MyChart before the doctors have had a chance to review them. We understand that in some cases there may be results that are confusing or concerning to you. Please understand that not all results are received at the same time and often the doctors may need to interpret multiple results in order to provide you with the best plan of care or course of treatment. Therefore, we ask that you please give us 2 business days to thoroughly review all your results before contacting the office for clarification. Should we see a critical lab result, you will be contacted sooner.   If You Need Anything After Your Visit  If you have any questions or concerns for your doctor, please call our main line at [phone] and press option 4 to reach  your doctor's medical assistant. If no one answers, please leave a voicemail as directed and we will return your call as soon as possible. Messages left after 4 pm will be answered the following business day.   You may also send us a message via MyChart. We typically respond to MyChart messages within 1-2 business days.  For prescription refills, please ask your pharmacy to contact our office. Our fax number is [phone].  If you have an urgent issue when the clinic is closed that cannot wait until the next business day, you can page your doctor at the number below.    Please note that while we do our best to be available for urgent issues outside of office hours, we are not available 24/7.   If you have an urgent issue and are unable to reach us, you may choose to seek medical care at your doctor's office, retail clinic, urgent care center, or emergency room.  If you have a medical emergency, please immediately call 911 or go to the emergency department.  Pager Numbers  - Dr. Kowalski: [phone]  - Dr. Moye: [phone]  - Dr. Stewart: [phone]  In the event of inclement weather, please call our main line at [phone] for an update on the status of any delays or closures.  Dermatology Medication Tips: Please keep the boxes that topical medications come in in order to help keep track of the instructions about   where and how to use these. Pharmacies typically print the medication instructions only on the boxes and not directly on the medication tubes.   If your medication is too expensive, please contact our office at [phone] option 4 or send us a message through MyChart.   We are unable to tell what your co-pay for medications will be in advance as this is different depending on your insurance coverage. However, we may be able to find a substitute medication at lower cost or fill out paperwork to get insurance to cover a needed medication.   If a prior authorization  is required to get your medication covered by your insurance company, please allow us 1-2 business days to complete this process.  Drug prices often vary depending on where the prescription is filled and some pharmacies may offer cheaper prices.  The website www.goodrx.com contains coupons for medications through different pharmacies. The prices here do not account for what the cost may be with help from insurance (it may be cheaper with your insurance), but the website can give you the price if you did not use any insurance.  - You can print the associated coupon and take it with your prescription to the pharmacy.  - You may also stop by our office during regular business hours and pick up a GoodRx coupon card.  - If you need your prescription sent electronically to a different pharmacy, notify our office through [facility] MyChart or by phone at [phone] option 4.     Si Usted Necesita Algo Despus de Su Visita  Tambin puede enviarnos un mensaje a travs de MyChart. Por lo general respondemos a los mensajes de MyChart en el transcurso de 1 a 2 das hbiles.  Para renovar recetas, por favor pida a su farmacia que se ponga en contacto con nuestra oficina. Nuestro nmero de fax es el [phone].  Si tiene un asunto urgente cuando la clnica est cerrada y que no puede esperar hasta el siguiente da hbil, puede llamar/localizar a su doctor(a) al nmero que aparece a continuacin.   Por favor, tenga en cuenta que aunque hacemos todo lo posible para estar disponibles para asuntos urgentes fuera del horario de oficina, no estamos disponibles las 24 horas del da, los 7 das de la semana.   Si tiene un problema urgente y no puede comunicarse con nosotros, puede optar por buscar atencin mdica  en el consultorio de su doctor(a), en una clnica privada, en un centro de atencin urgente o en una sala de emergencias.  Si tiene una emergencia mdica, por favor llame inmediatamente al 911 o  vaya a la sala de emergencias.  Nmeros de bper  - Dr. Kowalski: [phone]  - Dra. Moye: [phone]  - Dra. Stewart: [phone]  En caso de inclemencias del tiempo, por favor llame a nuestra lnea principal al [phone] para una actualizacin sobre el estado de cualquier retraso o cierre.  Consejos para la medicacin en dermatologa: Por favor, guarde las cajas en las que vienen los medicamentos de uso tpico para ayudarle a seguir las instrucciones sobre dnde y cmo usarlos. Las farmacias generalmente imprimen las instrucciones del medicamento slo en las cajas y no directamente en los tubos del medicamento.   Si su medicamento es muy caro, por favor, pngase en contacto con nuestra oficina llamando al [phone] y presione la opcin 4 o envenos un mensaje a travs de MyChart.   No podemos decirle cul ser su copago por los medicamentos por adelantado ya que esto   es diferente dependiendo de la cobertura de su seguro. Sin embargo, es posible que podamos encontrar un medicamento sustituto a menor costo o llenar un formulario para que el seguro cubra el medicamento que se considera necesario.   Si se requiere una autorizacin previa para que su compaa de seguros cubra su medicamento, por favor permtanos de 1 a 2 das hbiles para completar este proceso.  Los precios de los medicamentos varan con frecuencia dependiendo del lugar de dnde se surte la receta y alguna farmacias pueden ofrecer precios ms baratos.  El sitio web www.goodrx.com tiene cupones para medicamentos de diferentes farmacias. Los precios aqu no tienen en cuenta lo que podra costar con la ayuda del seguro (puede ser ms barato con su seguro), pero el sitio web puede darle el precio si no utiliz ningn seguro.  - Puede imprimir el cupn correspondiente y llevarlo con su receta a la farmacia.  - Tambin puede pasar por nuestra oficina durante el horario de atencin regular y recoger una tarjeta de cupones  de GoodRx.  - Si necesita que su receta se enve electrnicamente a una farmacia diferente, informe a nuestra oficina a travs de MyChart de [facility] o por telfono llamando al [phone] y presione la opcin 4.  

## 2021-09-13 NOTE — Progress Notes (Signed)
   Follow-Up Visit   Subjective  Madeline Cabrera is a 76 y.o. female who presents for the following: Nummular Derm  (Arms, legs, back, 29mf/u, Cerave anti itch cream, pt did not get the Clobetasol/Cerave due to possible s/e of glaucoma, ).  Overall is much improved.  Not itching anymore.  Patient accompanied by husband.  The following portions of the chart were reviewed this encounter and updated as appropriate:       Review of Systems:  No other skin or systemic complaints except as noted in HPI or Assessment and Plan.  Objective  Well appearing patient in no apparent distress; mood and affect are within normal limits.  A focused examination was performed including arms, back, legs. Relevant physical exam findings are noted in the Assessment and Plan.  arms, back, legs Mild erythema L chest, L neck, trunk, ext clear    Assessment & Plan  Nummular dermatitis arms, back, legs  Chronic condition with duration or expected duration over one year. Currently well-controlled.   Cont CeraVe Anti Itch qd/bid prn itch, Can use plain CeraVe cream when not itching Continue Dove soap  Discussed Tacrolimus 0.1% oint if rash recurs and Cerave anti itch not helping (pt did not want to use a steroid due to risk of glaucoma)  Related Medications clobetasol (TEMOVATE) 0.05 % external solution Apply twice daily to affected areas as needed for itching. Avoid applying to face, groin, and axilla.   Return if symptoms worsen or fail to improve.  I, SOthelia Pulling RMA, am acting as scribe for TBrendolyn Patty MD .  Documentation: I have reviewed the above documentation for accuracy and completeness, and I agree with the above.  TBrendolyn PattyMD

## 2021-09-20 ENCOUNTER — Other Ambulatory Visit: Payer: Self-pay

## 2021-09-20 DIAGNOSIS — L300 Nummular dermatitis: Secondary | ICD-10-CM

## 2021-09-20 MED ORDER — TACROLIMUS 0.1 % EX OINT: TOPICAL_OINTMENT | Freq: Two times a day (BID) | CUTANEOUS | 0 refills | Status: DC

## 2021-10-12 DIAGNOSIS — M79672 Pain in left foot: Secondary | ICD-10-CM | POA: Diagnosis not present

## 2021-10-12 DIAGNOSIS — M7662 Achilles tendinitis, left leg: Secondary | ICD-10-CM | POA: Diagnosis not present

## 2021-10-20 DIAGNOSIS — H353231 Exudative age-related macular degeneration, bilateral, with active choroidal neovascularization: Secondary | ICD-10-CM | POA: Diagnosis not present

## 2021-10-21 DIAGNOSIS — M7662 Achilles tendinitis, left leg: Secondary | ICD-10-CM | POA: Diagnosis not present

## 2021-10-21 DIAGNOSIS — M722 Plantar fascial fibromatosis: Secondary | ICD-10-CM | POA: Diagnosis not present

## 2021-11-09 DIAGNOSIS — I25118 Atherosclerotic heart disease of native coronary artery with other forms of angina pectoris: Secondary | ICD-10-CM | POA: Diagnosis not present

## 2021-11-09 DIAGNOSIS — I110 Hypertensive heart disease with heart failure: Secondary | ICD-10-CM | POA: Diagnosis not present

## 2021-11-09 DIAGNOSIS — F3131 Bipolar disorder, current episode depressed, mild: Secondary | ICD-10-CM | POA: Diagnosis not present

## 2021-11-09 DIAGNOSIS — I5020 Unspecified systolic (congestive) heart failure: Secondary | ICD-10-CM | POA: Diagnosis not present

## 2021-11-09 DIAGNOSIS — Z6826 Body mass index (BMI) 26.0-26.9, adult: Secondary | ICD-10-CM | POA: Diagnosis not present

## 2021-11-09 DIAGNOSIS — F0390 Unspecified dementia without behavioral disturbance: Secondary | ICD-10-CM | POA: Diagnosis not present

## 2021-11-09 DIAGNOSIS — S99922D Unspecified injury of left foot, subsequent encounter: Secondary | ICD-10-CM | POA: Diagnosis not present

## 2021-11-09 DIAGNOSIS — Z7982 Long term (current) use of aspirin: Secondary | ICD-10-CM | POA: Diagnosis not present

## 2021-12-17 DIAGNOSIS — J069 Acute upper respiratory infection, unspecified: Secondary | ICD-10-CM | POA: Diagnosis not present

## 2022-01-06 ENCOUNTER — Other Ambulatory Visit: Payer: Self-pay | Admitting: Internal Medicine

## 2022-01-06 DIAGNOSIS — Z1231 Encounter for screening mammogram for malignant neoplasm of breast: Secondary | ICD-10-CM

## 2022-01-10 DIAGNOSIS — Z79899 Other long term (current) drug therapy: Secondary | ICD-10-CM | POA: Diagnosis not present

## 2022-01-10 DIAGNOSIS — M5126 Other intervertebral disc displacement, lumbar region: Secondary | ICD-10-CM | POA: Diagnosis not present

## 2022-01-10 DIAGNOSIS — M5136 Other intervertebral disc degeneration, lumbar region: Secondary | ICD-10-CM | POA: Diagnosis not present

## 2022-01-10 DIAGNOSIS — M7061 Trochanteric bursitis, right hip: Secondary | ICD-10-CM | POA: Diagnosis not present

## 2022-01-10 DIAGNOSIS — M48062 Spinal stenosis, lumbar region with neurogenic claudication: Secondary | ICD-10-CM | POA: Diagnosis not present

## 2022-01-10 DIAGNOSIS — M7062 Trochanteric bursitis, left hip: Secondary | ICD-10-CM | POA: Diagnosis not present

## 2022-01-10 DIAGNOSIS — M5416 Radiculopathy, lumbar region: Secondary | ICD-10-CM | POA: Diagnosis not present

## 2022-01-25 DIAGNOSIS — I38 Endocarditis, valve unspecified: Secondary | ICD-10-CM | POA: Diagnosis not present

## 2022-01-25 DIAGNOSIS — N1832 Chronic kidney disease, stage 3b: Secondary | ICD-10-CM | POA: Diagnosis not present

## 2022-01-25 DIAGNOSIS — I10 Essential (primary) hypertension: Secondary | ICD-10-CM | POA: Diagnosis not present

## 2022-01-25 DIAGNOSIS — I6523 Occlusion and stenosis of bilateral carotid arteries: Secondary | ICD-10-CM | POA: Diagnosis not present

## 2022-01-25 DIAGNOSIS — I779 Disorder of arteries and arterioles, unspecified: Secondary | ICD-10-CM | POA: Diagnosis not present

## 2022-01-25 DIAGNOSIS — I447 Left bundle-branch block, unspecified: Secondary | ICD-10-CM | POA: Diagnosis not present

## 2022-01-25 DIAGNOSIS — I2510 Atherosclerotic heart disease of native coronary artery without angina pectoris: Secondary | ICD-10-CM | POA: Diagnosis not present

## 2022-01-26 DIAGNOSIS — H353211 Exudative age-related macular degeneration, right eye, with active choroidal neovascularization: Secondary | ICD-10-CM | POA: Diagnosis not present

## 2022-01-26 DIAGNOSIS — H353220 Exudative age-related macular degeneration, left eye, stage unspecified: Secondary | ICD-10-CM | POA: Diagnosis not present

## 2022-01-26 DIAGNOSIS — H353221 Exudative age-related macular degeneration, left eye, with active choroidal neovascularization: Secondary | ICD-10-CM | POA: Diagnosis not present

## 2022-01-31 DIAGNOSIS — N1832 Chronic kidney disease, stage 3b: Secondary | ICD-10-CM | POA: Diagnosis not present

## 2022-01-31 DIAGNOSIS — I2510 Atherosclerotic heart disease of native coronary artery without angina pectoris: Secondary | ICD-10-CM | POA: Diagnosis not present

## 2022-01-31 DIAGNOSIS — R7303 Prediabetes: Secondary | ICD-10-CM | POA: Diagnosis not present

## 2022-02-07 DIAGNOSIS — F0390 Unspecified dementia without behavioral disturbance: Secondary | ICD-10-CM | POA: Diagnosis not present

## 2022-02-07 DIAGNOSIS — R7303 Prediabetes: Secondary | ICD-10-CM | POA: Diagnosis not present

## 2022-02-07 DIAGNOSIS — N1832 Chronic kidney disease, stage 3b: Secondary | ICD-10-CM | POA: Diagnosis not present

## 2022-02-07 DIAGNOSIS — I6523 Occlusion and stenosis of bilateral carotid arteries: Secondary | ICD-10-CM | POA: Diagnosis not present

## 2022-02-07 DIAGNOSIS — T466X5A Adverse effect of antihyperlipidemic and antiarteriosclerotic drugs, initial encounter: Secondary | ICD-10-CM | POA: Diagnosis not present

## 2022-02-07 DIAGNOSIS — F319 Bipolar disorder, unspecified: Secondary | ICD-10-CM | POA: Diagnosis not present

## 2022-02-07 DIAGNOSIS — M791 Myalgia, unspecified site: Secondary | ICD-10-CM | POA: Diagnosis not present

## 2022-02-07 DIAGNOSIS — I10 Essential (primary) hypertension: Secondary | ICD-10-CM | POA: Diagnosis not present

## 2022-02-07 DIAGNOSIS — Z0000 Encounter for general adult medical examination without abnormal findings: Secondary | ICD-10-CM | POA: Diagnosis not present

## 2022-02-07 DIAGNOSIS — I2510 Atherosclerotic heart disease of native coronary artery without angina pectoris: Secondary | ICD-10-CM | POA: Diagnosis not present

## 2022-02-08 DIAGNOSIS — M5126 Other intervertebral disc displacement, lumbar region: Secondary | ICD-10-CM | POA: Diagnosis not present

## 2022-02-08 DIAGNOSIS — M5416 Radiculopathy, lumbar region: Secondary | ICD-10-CM | POA: Diagnosis not present

## 2022-02-22 ENCOUNTER — Ambulatory Visit
Admission: RE | Admit: 2022-02-22 | Discharge: 2022-02-22 | Disposition: A | Discharge status: Home / Self Care | Payer: PPO | Source: Ambulatory Visit | Attending: Internal Medicine | Admitting: Internal Medicine

## 2022-02-22 DIAGNOSIS — Z1231 Encounter for screening mammogram for malignant neoplasm of breast: Secondary | ICD-10-CM

## 2022-04-11 DIAGNOSIS — M25511 Pain in right shoulder: Secondary | ICD-10-CM | POA: Diagnosis not present

## 2022-04-11 DIAGNOSIS — M7061 Trochanteric bursitis, right hip: Secondary | ICD-10-CM | POA: Diagnosis not present

## 2022-04-11 DIAGNOSIS — Z79899 Other long term (current) drug therapy: Secondary | ICD-10-CM | POA: Diagnosis not present

## 2022-04-11 DIAGNOSIS — M5126 Other intervertebral disc displacement, lumbar region: Secondary | ICD-10-CM | POA: Diagnosis not present

## 2022-04-11 DIAGNOSIS — M5416 Radiculopathy, lumbar region: Secondary | ICD-10-CM | POA: Diagnosis not present

## 2022-04-11 DIAGNOSIS — M7062 Trochanteric bursitis, left hip: Secondary | ICD-10-CM | POA: Diagnosis not present

## 2022-04-11 DIAGNOSIS — M7541 Impingement syndrome of right shoulder: Secondary | ICD-10-CM | POA: Diagnosis not present

## 2022-04-11 DIAGNOSIS — M48062 Spinal stenosis, lumbar region with neurogenic claudication: Secondary | ICD-10-CM | POA: Diagnosis not present

## 2022-04-26 DIAGNOSIS — M79671 Pain in right foot: Secondary | ICD-10-CM | POA: Diagnosis not present

## 2022-04-26 DIAGNOSIS — M722 Plantar fascial fibromatosis: Secondary | ICD-10-CM | POA: Diagnosis not present

## 2022-04-26 DIAGNOSIS — M79672 Pain in left foot: Secondary | ICD-10-CM | POA: Diagnosis not present

## 2022-04-26 DIAGNOSIS — N1832 Chronic kidney disease, stage 3b: Secondary | ICD-10-CM | POA: Diagnosis not present

## 2022-04-26 DIAGNOSIS — R7303 Prediabetes: Secondary | ICD-10-CM | POA: Diagnosis not present

## 2022-05-02 ENCOUNTER — Emergency Department: Payer: PPO

## 2022-05-02 ENCOUNTER — Inpatient Hospital Stay
Admission: EM | Admit: 2022-05-02 | Discharge: 2022-05-04 | DRG: 389 | Disposition: A | Discharge status: Home / Self Care | Payer: PPO | Attending: Internal Medicine | Admitting: Internal Medicine

## 2022-05-02 DIAGNOSIS — Z888 Allergy status to other drugs, medicaments and biological substances status: Secondary | ICD-10-CM

## 2022-05-02 DIAGNOSIS — Z880 Allergy status to penicillin: Secondary | ICD-10-CM | POA: Diagnosis not present

## 2022-05-02 DIAGNOSIS — Z881 Allergy status to other antibiotic agents status: Secondary | ICD-10-CM | POA: Diagnosis not present

## 2022-05-02 DIAGNOSIS — N1832 Chronic kidney disease, stage 3b: Secondary | ICD-10-CM | POA: Diagnosis present

## 2022-05-02 DIAGNOSIS — Z5320 Procedure and treatment not carried out because of patient's decision for unspecified reasons: Secondary | ICD-10-CM | POA: Diagnosis not present

## 2022-05-02 DIAGNOSIS — Z8673 Personal history of transient ischemic attack (TIA), and cerebral infarction without residual deficits: Secondary | ICD-10-CM

## 2022-05-02 DIAGNOSIS — R112 Nausea with vomiting, unspecified: Secondary | ICD-10-CM | POA: Diagnosis present

## 2022-05-02 DIAGNOSIS — Z8249 Family history of ischemic heart disease and other diseases of the circulatory system: Secondary | ICD-10-CM

## 2022-05-02 DIAGNOSIS — R0789 Other chest pain: Secondary | ICD-10-CM | POA: Diagnosis not present

## 2022-05-02 DIAGNOSIS — I129 Hypertensive chronic kidney disease with stage 1 through stage 4 chronic kidney disease, or unspecified chronic kidney disease: Secondary | ICD-10-CM | POA: Diagnosis present

## 2022-05-02 DIAGNOSIS — Z86010 Personal history of colonic polyps: Secondary | ICD-10-CM | POA: Diagnosis not present

## 2022-05-02 DIAGNOSIS — Z833 Family history of diabetes mellitus: Secondary | ICD-10-CM

## 2022-05-02 DIAGNOSIS — F319 Bipolar disorder, unspecified: Secondary | ICD-10-CM | POA: Diagnosis not present

## 2022-05-02 DIAGNOSIS — Z85828 Personal history of other malignant neoplasm of skin: Secondary | ICD-10-CM | POA: Diagnosis not present

## 2022-05-02 DIAGNOSIS — I2510 Atherosclerotic heart disease of native coronary artery without angina pectoris: Secondary | ICD-10-CM | POA: Diagnosis not present

## 2022-05-02 DIAGNOSIS — R079 Chest pain, unspecified: Secondary | ICD-10-CM | POA: Diagnosis not present

## 2022-05-02 DIAGNOSIS — K5900 Constipation, unspecified: Secondary | ICD-10-CM | POA: Diagnosis not present

## 2022-05-02 DIAGNOSIS — Z87891 Personal history of nicotine dependence: Secondary | ICD-10-CM | POA: Diagnosis not present

## 2022-05-02 DIAGNOSIS — M810 Age-related osteoporosis without current pathological fracture: Secondary | ICD-10-CM | POA: Diagnosis not present

## 2022-05-02 DIAGNOSIS — Z7982 Long term (current) use of aspirin: Secondary | ICD-10-CM | POA: Diagnosis not present

## 2022-05-02 DIAGNOSIS — I10 Essential (primary) hypertension: Secondary | ICD-10-CM | POA: Diagnosis present

## 2022-05-02 DIAGNOSIS — Z981 Arthrodesis status: Secondary | ICD-10-CM | POA: Diagnosis not present

## 2022-05-02 DIAGNOSIS — F0390 Unspecified dementia without behavioral disturbance: Secondary | ICD-10-CM | POA: Diagnosis present

## 2022-05-02 DIAGNOSIS — K56609 Unspecified intestinal obstruction, unspecified as to partial versus complete obstruction: Principal | ICD-10-CM

## 2022-05-02 DIAGNOSIS — Z7951 Long term (current) use of inhaled steroids: Secondary | ICD-10-CM

## 2022-05-02 DIAGNOSIS — F0393 Unspecified dementia, unspecified severity, with mood disturbance: Secondary | ICD-10-CM | POA: Diagnosis not present

## 2022-05-02 DIAGNOSIS — Z955 Presence of coronary angioplasty implant and graft: Secondary | ICD-10-CM

## 2022-05-02 DIAGNOSIS — I700 Atherosclerosis of aorta: Secondary | ICD-10-CM | POA: Diagnosis not present

## 2022-05-02 DIAGNOSIS — K219 Gastro-esophageal reflux disease without esophagitis: Secondary | ICD-10-CM | POA: Diagnosis present

## 2022-05-02 DIAGNOSIS — Z79899 Other long term (current) drug therapy: Secondary | ICD-10-CM

## 2022-05-02 DIAGNOSIS — K56600 Partial intestinal obstruction, unspecified as to cause: Secondary | ICD-10-CM | POA: Diagnosis not present

## 2022-05-02 DIAGNOSIS — Z90710 Acquired absence of both cervix and uterus: Secondary | ICD-10-CM

## 2022-05-02 DIAGNOSIS — M1990 Unspecified osteoarthritis, unspecified site: Secondary | ICD-10-CM | POA: Diagnosis present

## 2022-05-02 DIAGNOSIS — E785 Hyperlipidemia, unspecified: Secondary | ICD-10-CM | POA: Diagnosis present

## 2022-05-02 DIAGNOSIS — K5651 Intestinal adhesions [bands], with partial obstruction: Secondary | ICD-10-CM | POA: Diagnosis not present

## 2022-05-02 DIAGNOSIS — F32A Depression, unspecified: Secondary | ICD-10-CM | POA: Diagnosis present

## 2022-05-02 DIAGNOSIS — R682 Dry mouth, unspecified: Secondary | ICD-10-CM | POA: Diagnosis present

## 2022-05-02 LAB — TROPONIN I (HIGH SENSITIVITY)
Troponin I (High Sensitivity): 11 ng/L (ref <18)
Troponin I (High Sensitivity): 11 ng/L (ref <18)

## 2022-05-02 LAB — COMPREHENSIVE METABOLIC PANEL
ALT: 18 U/L (ref 0–44)
AST: 26 U/L (ref 15–41)
Albumin: 4.1 g/dL (ref 3.5–5.0)
Alkaline Phosphatase: 65 U/L (ref 38–126)
Anion gap: 8 (ref 5–15)
BUN: 33 mg/dL — ABNORMAL HIGH (ref 8–23)
CO2: 22 mmol/L (ref 22–32)
Calcium: 9.7 mg/dL (ref 8.9–10.3)
Chloride: 107 mmol/L (ref 98–111)
Creatinine, Ser: 1.34 mg/dL — ABNORMAL HIGH (ref 0.44–1.00)
GFR, Estimated: 41 mL/min — ABNORMAL LOW (ref >60)
Glucose, Bld: 98 mg/dL (ref 70–99)
Potassium: 3.9 mmol/L (ref 3.5–5.1)
Sodium: 137 mmol/L (ref 135–145)
Total Bilirubin: 0.8 mg/dL (ref 0.3–1.2)
Total Protein: 7 g/dL (ref 6.5–8.1)

## 2022-05-02 LAB — CBC WITH DIFFERENTIAL/PLATELET
Abs Immature Granulocytes: 0.05 10*3/uL (ref 0.00–0.07)
Basophils Absolute: 0.1 10*3/uL (ref 0.0–0.1)
Basophils Relative: 1 %
Eosinophils Absolute: 0.2 10*3/uL (ref 0.0–0.5)
Eosinophils Relative: 1 %
HCT: 39.8 % (ref 36.0–46.0)
Hemoglobin: 13.3 g/dL (ref 12.0–15.0)
Immature Granulocytes: 0 %
Lymphocytes Relative: 26 %
Lymphs Abs: 3.4 10*3/uL (ref 0.7–4.0)
MCH: 31.8 pg (ref 26.0–34.0)
MCHC: 33.4 g/dL (ref 30.0–36.0)
MCV: 95.2 fL (ref 80.0–100.0)
Monocytes Absolute: 1.1 10*3/uL — ABNORMAL HIGH (ref 0.1–1.0)
Monocytes Relative: 8 %
Neutro Abs: 8.1 10*3/uL — ABNORMAL HIGH (ref 1.7–7.7)
Neutrophils Relative %: 64 %
Platelets: 261 10*3/uL (ref 150–400)
RBC: 4.18 MIL/uL (ref 3.87–5.11)
RDW: 12.2 % (ref 11.5–15.5)
WBC: 12.8 10*3/uL — ABNORMAL HIGH (ref 4.0–10.5)
nRBC: 0 % (ref 0.0–0.2)

## 2022-05-02 LAB — LIPASE, BLOOD: Lipase: 28 U/L (ref 11–51)

## 2022-05-02 MED ORDER — SODIUM CHLORIDE 0.9 % IV BOLUS
1000.0000 mL | Freq: Once | INTRAVENOUS | Status: AC
  Administered 2022-05-02: 1000 mL via INTRAVENOUS

## 2022-05-02 MED ORDER — ONDANSETRON HCL 4 MG PO TABS: 4.0000 mg | ORAL_TABLET | Freq: Four times a day (QID) | ORAL | Status: DC | PRN

## 2022-05-02 MED ORDER — ONDANSETRON 4 MG PO TBDP
ORAL_TABLET | ORAL | Status: AC
  Filled 2022-05-02: qty 1

## 2022-05-02 MED ORDER — HYDRALAZINE HCL 20 MG/ML IJ SOLN
5.0000 mg | Freq: Four times a day (QID) | INTRAMUSCULAR | Status: DC | PRN
  Administered 2022-05-03 (×2): 5 mg via INTRAVENOUS
  Filled 2022-05-02 (×2): qty 1

## 2022-05-02 MED ORDER — LORAZEPAM 2 MG/ML IJ SOLN
1.0000 mg | Freq: Once | INTRAMUSCULAR | Status: AC
  Administered 2022-05-02: 1 mg via INTRAVENOUS
  Filled 2022-05-02: qty 1

## 2022-05-02 MED ORDER — CARVEDILOL 6.25 MG PO TABS: 3.1250 mg | ORAL_TABLET | Freq: Two times a day (BID) | ORAL | Status: DC

## 2022-05-02 MED ORDER — HYDRALAZINE HCL 20 MG/ML IJ SOLN: 5.0000 mg | Freq: Three times a day (TID) | INTRAMUSCULAR | Status: DC | PRN

## 2022-05-02 MED ORDER — ONDANSETRON HCL 4 MG/2ML IJ SOLN
4.0000 mg | Freq: Four times a day (QID) | INTRAMUSCULAR | Status: DC | PRN
  Administered 2022-05-03: 4 mg via INTRAVENOUS
  Filled 2022-05-02: qty 2

## 2022-05-02 MED ORDER — FENTANYL CITRATE PF 50 MCG/ML IJ SOSY: 12.5000 ug | PREFILLED_SYRINGE | INTRAMUSCULAR | Status: AC | PRN

## 2022-05-02 MED ORDER — SODIUM CHLORIDE 0.9 % IV SOLN: 12.5000 mg | Freq: Four times a day (QID) | INTRAVENOUS | Status: AC | PRN

## 2022-05-02 MED ORDER — ACETAMINOPHEN 325 MG PO TABS: 650.0000 mg | ORAL_TABLET | Freq: Four times a day (QID) | ORAL | Status: DC | PRN

## 2022-05-02 MED ORDER — ONDANSETRON 4 MG PO TBDP
4.0000 mg | ORAL_TABLET | Freq: Once | ORAL | Status: AC
  Administered 2022-05-02: 4 mg via ORAL

## 2022-05-02 MED ORDER — IOHEXOL 350 MG/ML SOLN
80.0000 mL | Freq: Once | INTRAVENOUS | Status: AC | PRN
  Administered 2022-05-02: 80 mL via INTRAVENOUS

## 2022-05-02 MED ORDER — MORPHINE SULFATE (PF) 4 MG/ML IV SOLN
4.0000 mg | INTRAVENOUS | Status: AC | PRN
  Administered 2022-05-03: 4 mg via INTRAVENOUS
  Filled 2022-05-02: qty 1

## 2022-05-02 MED ORDER — LACTATED RINGERS IV SOLN: INTRAVENOUS | Status: AC

## 2022-05-02 MED ORDER — MORPHINE SULFATE (PF) 4 MG/ML IV SOLN
4.0000 mg | INTRAVENOUS | Status: DC | PRN
  Administered 2022-05-02: 4 mg via INTRAVENOUS
  Filled 2022-05-02: qty 1

## 2022-05-02 MED ORDER — ACETAMINOPHEN 650 MG RE SUPP: 650.0000 mg | Freq: Four times a day (QID) | RECTAL | Status: DC | PRN

## 2022-05-02 MED ORDER — LORAZEPAM 2 MG/ML IJ SOLN: 0.5000 mg | Freq: Four times a day (QID) | INTRAMUSCULAR | Status: AC | PRN

## 2022-05-02 MED ORDER — LIDOCAINE HCL URETHRAL/MUCOSAL 2 % EX GEL
1.0000 | Freq: Once | CUTANEOUS | Status: AC
  Administered 2022-05-02: 1
  Filled 2022-05-02: qty 10

## 2022-05-02 NOTE — ED Notes (Signed)
MD Roxan Hockey notified of Dr. Awanda Mink concern.

## 2022-05-02 NOTE — ED Provider Notes (Signed)
[facility] Provider Note    Event Date/Time   First MD Initiated Contact with Patient 05/02/22 2027     (approximate)   History   Chest Pain   HPI  Madeline Cabrera is a 77 y.o. female who presents to the ER for evaluation of severe epigastric pain chest pain radiating to shoulder and into her back.  Sudden onset this evening.  Has had urged to vomit and gag.  Unable to keep anything down.  Has never had pain like this before.     Physical Exam   Triage Vital Signs: ED Triage Vitals  Enc Vitals Group     BP 05/02/22 2010 (!) 105/92     Pulse Rate 05/02/22 2010 62     Resp 05/02/22 2010 (!) 22     Temp 05/02/22 2010 98.3 F (36.8 C)     Temp Source 05/02/22 2010 Oral     SpO2 05/02/22 2010 97 %     Weight 05/02/22 2003 150 lb (68 kg)     Height 05/02/22 2003 5\' 3"  (1.6 m)     Head Circumference --      Peak Flow --      Pain Score 05/02/22 2002 10     Pain Loc --      Pain Edu? --      Excl. in GC? --     Most recent vital signs: Vitals:   05/02/22 2154 05/02/22 2300  BP: (!) 192/60   Pulse: (!) 59 62  Resp: 15 16  Temp:    SpO2: 99% 100%     Constitutional: Alert  Eyes: Conjunctivae are normal.  Head: Atraumatic. Nose: No congestion/rhinnorhea. Mouth/Throat: Mucous membranes are moist.   Neck: Painless ROM.  Cardiovascular:   Good peripheral circulation. Respiratory: Normal respiratory effort.  No retractions.  Gastrointestinal: Soft and nontender.  Musculoskeletal:  no deformity Neurologic:  MAE spontaneously. No gross focal neurologic deficits are appreciated.  Skin:  Skin is warm, dry and intact. No rash noted. Psychiatric: Mood and affect are anxious. Speech and behavior are normal.    ED Results / Procedures / Treatments   Labs (all labs ordered are listed, but only abnormal results are displayed) Labs Reviewed  CBC WITH DIFFERENTIAL/PLATELET - Abnormal; Notable for the following components:      Result Value    WBC 12.8 (*)    Neutro Abs 8.1 (*)    Monocytes Absolute 1.1 (*)    All other components within normal limits  COMPREHENSIVE METABOLIC PANEL - Abnormal; Notable for the following components:   BUN 33 (*)    Creatinine, Ser 1.34 (*)    GFR, Estimated 41 (*)    All other components within normal limits  LIPASE, BLOOD  BASIC METABOLIC PANEL  CBC  TROPONIN I (HIGH SENSITIVITY)  TROPONIN I (HIGH SENSITIVITY)     EKG  ED ECG REPORT I, Willy Eddy, the attending physician, personally viewed and interpreted this ECG.   Date: 05/02/2022  EKG Time: 20:04  Rate: 65  Rhythm: sinus  Axis: normal  Intervals: lbbb  ST&T Change:  no stemi, unchanged from previous    RADIOLOGY Please see ED Course for my review and interpretation.  I personally reviewed all radiographic images ordered to evaluate for the above acute complaints and reviewed radiology reports and findings.  These findings were personally discussed with the patient.  Please see medical record for radiology report.    PROCEDURES:  Critical Care performed: No  Procedures   MEDICATIONS ORDERED IN ED: Medications  acetaminophen (TYLENOL) tablet 650 mg (has no administration in time range)    Or  acetaminophen (TYLENOL) suppository 650 mg (has no administration in time range)  ondansetron (ZOFRAN) tablet 4 mg (has no administration in time range)    Or  ondansetron (ZOFRAN) injection 4 mg (has no administration in time range)  morphine (PF) 4 MG/ML injection 4 mg (has no administration in time range)  fentaNYL (SUBLIMAZE) injection 12.5 mcg (has no administration in time range)  promethazine (PHENERGAN) 12.5 mg in sodium chloride 0.9 % 50 mL IVPB (has no administration in time range)  sodium chloride 0.9 % bolus 1,000 mL (has no administration in time range)  lactated ringers infusion (has no administration in time range)  hydrALAZINE (APRESOLINE) injection 5 mg (has no administration in time range)  LORazepam  (ATIVAN) injection 0.5 mg (has no administration in time range)  lidocaine (XYLOCAINE) 2 % jelly 1 Application (has no administration in time range)  LORazepam (ATIVAN) injection 1 mg (has no administration in time range)  ondansetron (ZOFRAN-ODT) disintegrating tablet 4 mg (4 mg Oral Not Given 05/02/22 2032)  iohexol (OMNIPAQUE) 350 MG/ML injection 80 mL (80 mLs Intravenous Contrast Given 05/02/22 2103)     IMPRESSION / MDM / ASSESSMENT AND PLAN / ED COURSE  I reviewed the triage vital signs and the nursing notes.                              Differential diagnosis includes, but is not limited to, ACS, pericarditis, esophagitis, boerhaaves, pe, dissection, pna, bronchitis, costochondritis  Patient presenting to the ER for evaluation of symptoms as described above.  Based on symptoms, risk factors and considered above differential, this presenting complaint could reflect a potentially life-threatening illness therefore the patient will be placed on continuous pulse oximetry and telemetry for monitoring.  Laboratory evaluation will be sent to evaluate for the above complaints.     Clinical Course as of 05/02/22 2337  Mon May 02, 2022  2056 Chest x-ray on my review and interpretation without evidence of pneumothorax.  Given patient's pain, hypertension and description of symptoms we will order CTA to further evaluate for dissection. [PR]  2122 UTI on my review and interpretation without evidence of dissection.  Will await formal radiology report. [PR]  2245 With persistent epigastric pain feel like she is about to vomit.  Imaging does appear consistent with small bowel obstruction.  Surgery aware.  Will place NG tube will consult hospitalist for admission. [PR]    Clinical Course User Index [PR] Willy Eddy, MD     FINAL CLINICAL IMPRESSION(S) / ED DIAGNOSES   Final diagnoses:  Small bowel obstruction (HCC)     Rx / DC Orders   ED Discharge Orders     None        Note:   This document was prepared using Dragon voice recognition software and may include unintentional dictation errors.    Willy Eddy, MD 05/02/22 [id number]

## 2022-05-02 NOTE — Assessment & Plan Note (Signed)
At baseline 

## 2022-05-02 NOTE — Assessment & Plan Note (Signed)
Home Celexa not resumed on admission

## 2022-05-02 NOTE — ED Notes (Signed)
Reviewed pt's c/o--Dr [facility] aware; acuity level changed and will take pt to next available exam room

## 2022-05-02 NOTE — ED Notes (Signed)
Patient transported to CT 

## 2022-05-02 NOTE — Assessment & Plan Note (Addendum)
Home antihypertensive medication: Losartan, Coreg, amlodipine not resumed on admission due to intolerance of p.o. intake A.m. team to resume home antihypertensive medications when the benefits outweigh the risk Hydralazine 5 mg IV every 6 hours as needed for SBP greater 175, 3 days ordered

## 2022-05-02 NOTE — ED Notes (Signed)
Pt ambulated to the toilet with this RN's assistance

## 2022-05-02 NOTE — Assessment & Plan Note (Addendum)
Symptomatic support General surgery consulted by EDP, appreciate consultation and guidance Sodium chloride 1 L bolus ordered on admission followed by LR 150 mL/h, 1 day ordered Agree with NG tube placement if patient has persistent nausea and vomiting.  NG tube placement can be deferred if patient is not having active vomiting. Morphine 4 mg IV every 4 hours as needed for moderate pain and fentanyl 12.5 mcg IV every 3 hours as needed for severe pain, 12 hours of coverage ordered; AM team to reevaluate patient at bedside for continued IV the opioid pain requirements Ondansetron 4 mg p.o./IV every 6 hours.  For nausea and vomiting 5 days ordered; Phenergan 12.5 mg IV every 6 hours as needed for refractory nausea and vomiting, 12 hours of coverage ordered N.p.o. except for sips with meds Admit to telemetry surgical inpatient

## 2022-05-02 NOTE — Assessment & Plan Note (Signed)
Aspirin 81 mg daily, Coreg 3.125 mg p.o. twice daily, amlodipine 2.5 mg, losartan 25 mg not resumed on admission AM team to resume the above when the benefits outweigh the risk

## 2022-05-02 NOTE — ED Triage Notes (Addendum)
To triage via wheelchair with c/o chest pain, shoulder blades, and upper back since apx 1pm today. Reports was making dinner when pain began suddenly. Pain sharp and stabbing in nature, constant.  Also c/o sob and nausea and vomiting that just began in triage. Pt restless in wheelchair, having difficulty sitting still and tearful

## 2022-05-02 NOTE — ED Notes (Signed)
Patient transported back from CT 

## 2022-05-02 NOTE — ED Notes (Signed)
ED Provider at bedside. 

## 2022-05-02 NOTE — H&P (Addendum)
History and Physical   NETANYA YAZDANI UJW:[id number] DOB: 02-Jun-1945 DOA: 05/02/2022  PCP: Lauro Regulus, MD  Outpatient Specialists: Dr. Albertina Parr, physical medicine Patient coming from: Home  I have personally briefly reviewed patient's old medical records in [facility] EMR.  Chief Concern: Chest pain, abdominal pain  HPI: Madeline Cabrera is a 77 year old female with history of depression, hypertension, hyperlipidemia, lumbar radiculitis lumbar stenosis, bipolar 1 disorder, bilateral carotid artery stenosis, CAD, dementia, CKD stage IIIb, history of hysterectomy, who presents emergency department for chief concerns of abdominal pain, chest pain, shoulder pain.  Vitals in the ED showed temperature of 98.3, respiration rate of 17, heart rate of 58, blood pressure 156/98, SpO2 of 100% on room air.  Serum sodium is 137, potassium 3.9, chloride 107, bicarb 22, BUN of 33, serum creatinine 1.34, EGFR 41, nonfasting blood glucose 98, WBC 12.8, hemoglobin 13.3, platelets of 261.  High sensitive troponin was 11 and on repeat was 11.  Patient is persistent nausea and vomiting, NG tube ordered for placement by EDP.  ED treatment: Ondansetron 4 mg p.o. one-time dose, morphine 4 mg IV, EDP consulted general surgery, Dr. Molly Maduro who recommends hospitalist admission. -------------------------- At bedside, patient was able to tell me her name, age, location, current calendar year.  She reports that today she started having severe abdominal pain, 10 out of 10 to her abdomen and chest discomfort.  She denies trauma to her person.  She endorses persistent nausea and gagging sensation.  She denies vomiting.  She denies dysuria, hematuria, diarrhea.  She reports she had a small bowel movement today at home.  She reports she is never had pain like this before.  She reports that she had a hysterectomy many many many years ago.  She endorses decreased appetite.  She reports that she last  ate a few hours ago when her husband brought her some fries and an egg sandwich which she ate very little of.  Social history: She lives at home with her husband.  She denies tobacco, EtOH, recreational drug use.  She is retired and formerly worked in Geologist, engineering.  ROS: Constitutional: no weight change, no fever ENT/Mouth: no sore throat, no rhinorrhea Eyes: no eye pain, no vision changes Cardiovascular: no chest pain, no dyspnea,  no edema, no palpitations Respiratory: no cough, no sputum, no wheezing Gastrointestinal: + nausea, + vomiting, no diarrhea, no constipation Genitourinary: no urinary incontinence, no dysuria, no hematuria Musculoskeletal: no arthralgias, no myalgias Skin: no skin lesions, no pruritus, Neuro: no weakness, no loss of consciousness, no syncope Psych: no anxiety, no depression, + decrease appetite Heme/Lymph: no bruising, no bleeding  ED Course: Discussed with emergency medicine provider, patient requiring hospitalization for chief concerns of small bowel obstruction.  Assessment/Plan  Principal Problem:   Small bowel obstruction (HCC) Active Problems:   Essential hypertension   Hyperlipidemia   CAD (coronary artery disease), native coronary artery   Depression   CKD stage 3b, GFR 30-44 ml/min (HCC)   Assessment and Plan:  * Small bowel obstruction (HCC) Symptomatic support General surgery consulted by EDP, appreciate consultation and guidance Sodium chloride 1 L bolus ordered on admission followed by LR 150 mL/h, 1 day ordered Agree with NG tube placement if patient has persistent nausea and vomiting.  NG tube placement can be deferred if patient is not having active vomiting. Morphine 4 mg IV every 4 hours as needed for moderate pain and fentanyl 12.5 mcg IV every 3 hours as needed for  severe pain, 12 hours of coverage ordered; AM team to reevaluate patient at bedside for continued IV the opioid pain requirements Ondansetron 4 mg p.o./IV  every 6 hours.  For nausea and vomiting 5 days ordered; Phenergan 12.5 mg IV every 6 hours as needed for refractory nausea and vomiting, 12 hours of coverage ordered N.p.o. except for sips with meds Admit to telemetry surgical inpatient  CKD stage 3b, GFR 30-44 ml/min (HCC) At baseline  Depression Home Celexa not resumed on admission  CAD (coronary artery disease), native coronary artery Aspirin 81 mg daily, Coreg 3.125 mg p.o. twice daily, amlodipine 2.5 mg, losartan 25 mg not resumed on admission AM team to resume the above when the benefits outweigh the risk  Hyperlipidemia Home lovastatin 40 mg nightly not resumed on admission  Essential hypertension Home antihypertensive medication: Losartan, Coreg, amlodipine not resumed on admission due to intolerance of p.o. intake A.m. team to resume home antihypertensive medications when the benefits outweigh the risk Hydralazine 5 mg IV every 6 hours as needed for SBP greater 175, 3 days ordered  Chart reviewed.   AM team to complete med reconciliation  DVT prophylaxis: TED hose; AM team to initiate pharmacologic DVT prophylaxis when the benefits outweigh the risk Code Status: Full code Diet: N.p.o. except for sips of meds Family Communication: No, patient reports her husband already knows she is being admitted to the hospital Disposition Plan: Pending clinical course, general surgery evaluation Consults called: EDP consulted general surgery, Dr. Claudine Mouton Admission status: Telemetry surgical, inpatient  Past Medical History:  Diagnosis Date   Anxiety    Arthritis    OSTEOARTHRITIS   Bipolar disorder (HCC)    Chronic fatigue syndrome    Constipation, chronic    Coronary artery disease    GERD (gastroesophageal reflux disease)    Headache(784.0)    Hx of basal cell carcinoma 05/15/2018   L upper lip below nasolabial. Excised 10/03/2018, margins free.   Hx of dysplastic nevus 03/12/2018   L medial shoulder, severe atypia    Hyperlipidemia    Hypertension    Iritis    Lumbar stenosis    Osteoporosis    Personal history of colonic polyps    PONV (postoperative nausea and vomiting)    Renal insufficiency    Stroke (HCC)    TIAs x2   Past Surgical History:  Procedure Laterality Date   ABDOMINAL HYSTERECTOMY     ACHILLES TENDON REPAIR Left    ARTHRODESIS METATARSALPHALANGEAL JOINT (MTPJ) Left 01/09/2015   Procedure: ARTHRODESIS METATARSALPHALANGEAL JOINT (MTPJ);  Surgeon: Recardo Evangelist, DPM;  Location: [facility];  Service: Podiatry;  Laterality: Left;   BACK SURGERY     LUMBAR SPINE FUSION   CARDIAC CATHETERIZATION     CATARACT EXTRACTION Bilateral    COLONOSCOPY WITH PROPOFOL N/A 04/10/2019   Procedure: COLONOSCOPY WITH PROPOFOL;  Surgeon: Toledo, Boykin Nearing, MD;  Location: [facility];  Service: Gastroenterology;  Laterality: N/A;   CORONARY ANGIOPLASTY WITH STENT PLACEMENT     ESOPHAGOGASTRODUODENOSCOPY (EGD) WITH PROPOFOL N/A 04/10/2019   Procedure: ESOPHAGOGASTRODUODENOSCOPY (EGD) WITH PROPOFOL;  Surgeon: Toledo, Boykin Nearing, MD;  Location: [facility];  Service: Gastroenterology;  Laterality: N/A;   FRACTURE SURGERY     RT.SHOULDER FRACTURE REPAIR   heel spurs Right    HEMORRHOID SURGERY     TRIGGER FINGER RELEASE Bilateral    Social History:  reports that she quit smoking about 20 years ago. She has a 0.50 pack-year smoking history. She has never used smokeless tobacco. She  reports that she does not drink alcohol and does not use drugs.  Allergies  Allergen Reactions   Clindamycin/Lincomycin     UNKNOWN   Doxycycline Nausea Only   Hydrocodone-Guaifenesin     UNKNOWN   Minocycline Nausea Only   Naprosyn [Naproxen]     UNKNOWN    Biaxin [Clarithromycin] Rash   Penicillins Rash   Family History  Problem Relation Age of Onset   Diabetes Mother    Heart disease Mother    Hypertension Brother    Heart disease Brother    Breast cancer Neg Hx    Family history: Family history reviewed and  not pertinent.  Prior to Admission medications   Medication Sig Start Date End Date Taking? Authorizing Provider  amLODipine (NORVASC) 2.5 MG tablet Take 2.5 mg by mouth daily.    [provider]  aspirin 81 MG tablet Take 81 mg by mouth daily.    [provider]  carvedilol (COREG) 3.125 MG tablet Take 3.125 mg by mouth 2 (two) times daily with a meal.    [provider]  cetirizine (ZYRTEC) 10 MG tablet Take 10 mg by mouth daily.    [provider]  cholecalciferol (VITAMIN D) 1000 UNITS tablet Take 1,000 Units by mouth daily.    [provider]  citalopram (CELEXA) 20 MG tablet Take 40 mg by mouth daily.     [provider]  clobetasol (TEMOVATE) 0.05 % external solution Apply twice daily to affected areas as needed for itching. Avoid applying to face, groin, and axilla. 08/04/21   Willeen Niece, MD  cyclobenzaprine (FLEXERIL) 5 MG tablet Take 1 tablet (5 mg total) by mouth 3 (three) times daily as needed for muscle spasms. 10/02/15   Menshew, Charlesetta Ivory, PA-C  Dexlansoprazole (DEXILANT) 30 MG capsule Take 30 mg by mouth daily.    [provider]  DiphenhydrAMINE HCl (ALLERGY MEDICATION PO) Take 1 tablet by mouth every evening.     [provider]  estrogens, conjugated, (PREMARIN) 1.25 MG tablet Take 1.25 mg by mouth every 7 (seven) days. Reported on 12/24/2014    [provider]  etodolac (LODINE) 200 MG capsule Take 1 capsule (200 mg total) by mouth every 8 (eight) hours. 06/09/16   Rebecka Apley, MD  famotidine (PEPCID) 20 MG tablet Take 20 mg by mouth 2 (two) times daily.    [provider]  Fluticasone-Salmeterol (ADVAIR) 100-50 MCG/DOSE AEPB Inhale 1 puff into the lungs 2 (two) times daily.    [provider]  Hypromellose (GENTEAL OP) Apply 2 drops to eye 2 (two) times daily.    [provider]  losartan (COZAAR) 25 MG tablet Take 25 mg by mouth daily.    [provider]  lovastatin (MEVACOR) 40 MG tablet Take 40 mg by mouth at bedtime.    [provider]  lubiprostone (AMITIZA) 8 MCG capsule Take 8 mcg by mouth 2 (two) times daily with a meal.    [provider]  omeprazole (PRILOSEC) 40 MG capsule Take 40 mg by mouth 2 (two) times daily.    [provider]  oxyCODONE-acetaminophen (PERCOCET) 7.5-325 MG tablet Take 1 tablet by mouth every 4 (four) hours as needed for severe pain. 01/09/15   Troxler, Molli Hazard, DPM  oxyCODONE-acetaminophen (ROXICET) 5-325 MG tablet Take 1 tablet by mouth every 6 (six) hours as needed. 06/09/16   Rebecka Apley, MD  pantoprazole (PROTONIX) 40 MG tablet Take 40 mg by mouth 2 (two)  times daily before a meal.    [provider]  polyethylene glycol (MIRALAX) 17 g packet Take 17 g by mouth daily. 04/19/18   Jeanmarie Plant, MD  predniSONE (DELTASONE) 10 MG tablet Take 1 tablet (10 mg total) by mouth 2 (two) times daily with a meal. 10/02/15   Menshew, Charlesetta Ivory, PA-C  scopolamine (TRANSDERM-SCOP) 1 MG/3DAYS Place 1 patch (1.5 mg total) onto the skin every 3 (three) days. 01/09/15   Troxler, Molli Hazard, DPM  tacrolimus (PROTOPIC) 0.1 % ointment Apply topically 2 (two) times daily. 09/20/21   Willeen Niece, MD  traMADol Janean Sark) 50 MG tablet Take by mouth 2 (two) times daily as needed.    [provider]  White Petrolatum-Mineral Oil (GENTEAL PM) 85-15 % OINT Apply 1 application to eye every evening.    [provider]   Physical Exam: Vitals:   05/02/22 2033 05/02/22 2047 05/02/22 2154 05/02/22 2300  BP:  (!) 186/98 (!) 192/60   Pulse: (!) 57 (!) 58 (!) 59 62  Resp: 16 17 15 16   Temp:      TempSrc:      SpO2: 100% 100% 99% 100%  Weight:      Height:       Constitutional: appears age-appropriate, mild distress and discomfort, calm Eyes: PERRL, lids and conjunctivae normal ENMT: Mucous membranes are moist. Posterior pharynx clear of any exudate or lesions. Age-appropriate  dentition.  Mild bilateral hearing loss. Neck: normal, supple, no masses, no thyromegaly Respiratory: clear to auscultation bilaterally, no wheezing, no crackles. Normal respiratory effort. No accessory muscle use.  Cardiovascular: Regular rate and rhythm, no murmurs / rubs / gallops. No extremity edema. 2+ pedal pulses. No carotid bruits.  Abdomen: Generalized tenderness, no masses palpated, no hepatosplenomegaly.  Tinkling bowel sounds. Musculoskeletal: no clubbing / cyanosis. No joint deformity upper and lower extremities. Good ROM, no contractures, no atrophy. Normal muscle tone.  Skin: no rashes, lesions, ulcers. No induration.  Vertical scar from bellybutton to suprapubic, appears well-healed and consistent with remote history of hysterectomy Neurologic: Sensation intact. Strength 5/5 in all 4.  Psychiatric: Normal judgment and insight. Alert and oriented x 3. Normal mood.   EKG: independently reviewed, showing sinus rhythm with rate of 64, QTc 470, left bundle branch block  Chest x-ray on Admission: I personally reviewed and I agree with radiologist reading as below.  CT Angio Chest/Abd/Pel for Dissection W and/or Wo Contrast  Result Date: 05/02/2022 CLINICAL DATA:  Acute aortic syndrome (AAS) suspected EXAM: CT ANGIOGRAPHY CHEST, ABDOMEN AND PELVIS TECHNIQUE: Non-contrast CT of the chest was initially obtained. Multidetector CT imaging through the chest, abdomen and pelvis was performed using the standard protocol during bolus administration of intravenous contrast. Multiplanar reconstructed images and MIPs were obtained and reviewed to evaluate the vascular anatomy. RADIATION DOSE REDUCTION: This exam was performed according to the departmental dose-optimization program which includes automated exposure control, adjustment of the mA and/or kV according to patient size and/or use of iterative reconstruction technique. CONTRAST:  80mL OMNIPAQUE IOHEXOL 350 MG/ML SOLN COMPARISON:  None  Available. FINDINGS: CTA CHEST FINDINGS Cardiovascular: Preferential opacification of the thoracic aorta. No evidence of thoracic aortic aneurysm or dissection. Normal heart size. No significant pericardial effusion. The thoracic aorta is normal in caliber. Mild atherosclerotic plaque of the thoracic aorta. Four vessel coronary artery calcifications. The main pulmonary artery is normal in caliber. No central pulmonary embolus. Mediastinum/Nodes: No enlarged mediastinal, hilar, or axillary lymph nodes. Thyroid gland, trachea, and esophagus demonstrate no  significant findings. Small hiatal hernia. Lungs/Pleura: No focal consolidation. No pulmonary nodule. No pulmonary mass. No pleural effusion. No pneumothorax. Musculoskeletal: No chest wall abnormality. No suspicious lytic or blastic osseous lesions. No acute displaced fracture. Old healed right humeral fracture. Review of the MIP images confirms the above findings. CTA ABDOMEN AND PELVIS FINDINGS VASCULAR Aorta: Moderate to severe atherosclerotic plaque. Normal caliber aorta without aneurysm, dissection, vasculitis or significant stenosis. Celiac: Patent without evidence of aneurysm, dissection, vasculitis or significant stenosis. SMA: Replaced right hepatic artery off of the SMA. Patent without evidence of aneurysm, dissection, vasculitis or significant stenosis. Renals: Both renal arteries are patent without evidence of aneurysm, dissection, vasculitis, fibromuscular dysplasia or significant stenosis. IMA: Mild atherosclerotic plaque. Patent without evidence of aneurysm, dissection, vasculitis or significant stenosis. Inflow: Patent without evidence of aneurysm, dissection, vasculitis or significant stenosis. Veins: No obvious venous abnormality within the limitations of this arterial phase study. Review of the MIP images confirms the above findings. NON-VASCULAR Hepatobiliary: No focal liver abnormality. No gallstones, gallbladder wall thickening, or  pericholecystic fluid. No biliary dilatation. Pancreas: Diffusely atrophic. No focal lesion. Otherwise normal pancreatic contour. No surrounding inflammatory changes. No main pancreatic ductal dilatation. Spleen: Normal in size without focal abnormality. Adrenals/Urinary Tract: No adrenal nodule bilaterally. Bilateral kidneys enhance symmetrically. Punctate right nephrolithiasis. No left nephrolithiasis. No ureterolithiasis bilaterally. No hydronephrosis. No hydroureter. The urinary bladder is unremarkable. Stomach/Bowel: Stomach is within normal limits. Several loops of proximal to mid small bowel dilated up to 3.1 cm with fluid. No evidence of large bowel wall thickening or dilatation. Stool throughout the colon. Colonic diverticulosis. Status post appendectomy. Lymphatic: No lymphadenopathy. Reproductive: Status post hysterectomy. No adnexal masses. Other: No intraperitoneal free fluid. No intraperitoneal free gas. No organized fluid collection. Musculoskeletal: No abdominal wall hernia or abnormality. No suspicious lytic or blastic osseous lesions. No acute displaced fracture. L5-S1 posterolateral and interbody surgical hardware. Review of the MIP images confirms the above findings. IMPRESSION: 1. No acute thoracic or abdominal aorta abnormality. 2. Aortic Atherosclerosis (ICD10-I70.0) including four-vessel coronary calcification. 3. Early/partial proximal to mid small bowel obstruction with it developing transition point within the left lower mid abdomen. 4. Colonic diverticulosis with no acute diverticulitis. 5. Stool throughout the colon-correlate for constipation. 6. Small hiatal hernia. 7. Nonobstructive punctate right nephrolithiasis. Electronically Signed   By: Tish Frederickson M.D.   On: 05/02/2022 22:15   DG Chest 1 View  Result Date: 05/02/2022 CLINICAL DATA:  [id number] Chest pain [id number] EXAM: CHEST  1 VIEW COMPARISON:  Chest x-ray 04/19/2018, CT chest 09/09/2020 FINDINGS: The heart and mediastinal  contours are within normal limits. Aortic calcification. No focal consolidation. No pulmonary edema. No pleural effusion. No pneumothorax. No acute osseous abnormality. IMPRESSION: 1. No active disease. 2.  Aortic Atherosclerosis (ICD10-I70.0). Electronically Signed   By: Tish Frederickson M.D.   On: 05/02/2022 20:38    Labs on Admission: I have personally reviewed following labs  CBC: Recent Labs  Lab 05/02/22 2005  WBC 12.8*  NEUTROABS 8.1*  HGB 13.3  HCT 39.8  MCV 95.2  PLT 261   Basic Metabolic Panel: Recent Labs  Lab 05/02/22 2005  NA 137  K 3.9  CL 107  CO2 22  GLUCOSE 98  BUN 33*  CREATININE 1.34*  CALCIUM 9.7   GFR: Estimated Creatinine Clearance: 32 mL/min (A) (by C-G formula based on SCr of 1.34 mg/dL (H)).  Liver Function Tests: Recent Labs  Lab 05/02/22 2005  AST 26  ALT 18  ALKPHOS 65  BILITOT 0.8  PROT 7.0  ALBUMIN 4.1   Recent Labs  Lab 05/02/22 2005  LIPASE 28   Urine analysis:    Component Value Date/Time   COLORURINE Yellow 04/03/2011 1422   APPEARANCEUR Hazy 04/03/2011 1422   LABSPEC 1.020 04/03/2011 1422   PHURINE 5.0 04/03/2011 1422   GLUCOSEU Negative 04/03/2011 1422   HGBUR Negative 04/03/2011 1422   BILIRUBINUR Negative 04/03/2011 1422   KETONESUR Negative 04/03/2011 1422   PROTEINUR Negative 04/03/2011 1422   NITRITE Negative 04/03/2011 1422   LEUKOCYTESUR Negative 04/03/2011 1422   This document was prepared using Dragon Voice Recognition software and may include unintentional dictation errors.  Dr. Sedalia Muta [facility]  If 7PM-7AM, please contact overnight-coverage provider If 7AM-7PM, please contact day attending provider [email or web address]  05/02/2022, 11:29 PM

## 2022-05-02 NOTE — Hospital Course (Addendum)
Ms. Madeline Cabrera is a 77 year old female with history of depression, hypertension, hyperlipidemia, lumbar radiculitis lumbar stenosis, bipolar 1 disorder, bilateral carotid artery stenosis, CAD, dementia, CKD stage IIIb, history of hysterectomy, who presents emergency department for chief concerns of abdominal pain, chest pain, shoulder pain.  Vitals in the ED showed temperature of 98.3, respiration rate of 17, heart rate of 58, blood pressure 156/98, SpO2 of 100% on room air.  Serum sodium is 137, potassium 3.9, chloride 107, bicarb 22, BUN of 33, serum creatinine 1.34, EGFR 41, nonfasting blood glucose 98, WBC 12.8, hemoglobin 13.3, platelets of 261.  High sensitive troponin was 11 and on repeat was 11.  Patient is persistent nausea and vomiting, NG tube ordered for placement by EDP.  ED treatment: Ondansetron 4 mg p.o. one-time dose, morphine 4 mg IV, EDP consulted general surgery, Dr. Molly Maduro who recommends hospitalist admission.

## 2022-05-02 NOTE — Assessment & Plan Note (Signed)
Home lovastatin 40 mg nightly not resumed on admission

## 2022-05-03 ENCOUNTER — Other Ambulatory Visit: Payer: Self-pay

## 2022-05-03 DIAGNOSIS — K56609 Unspecified intestinal obstruction, unspecified as to partial versus complete obstruction: Secondary | ICD-10-CM | POA: Diagnosis not present

## 2022-05-03 LAB — BASIC METABOLIC PANEL
Anion gap: 5 (ref 5–15)
BUN: 26 mg/dL — ABNORMAL HIGH (ref 8–23)
CO2: 23 mmol/L (ref 22–32)
Calcium: 8.6 mg/dL — ABNORMAL LOW (ref 8.9–10.3)
Chloride: 110 mmol/L (ref 98–111)
Creatinine, Ser: 1.09 mg/dL — ABNORMAL HIGH (ref 0.44–1.00)
GFR, Estimated: 52 mL/min — ABNORMAL LOW (ref >60)
Glucose, Bld: 108 mg/dL — ABNORMAL HIGH (ref 70–99)
Potassium: 4.2 mmol/L (ref 3.5–5.1)
Sodium: 138 mmol/L (ref 135–145)

## 2022-05-03 LAB — CBC
HCT: 36.9 % (ref 36.0–46.0)
Hemoglobin: 12.3 g/dL (ref 12.0–15.0)
MCH: 32.1 pg (ref 26.0–34.0)
MCHC: 33.3 g/dL (ref 30.0–36.0)
MCV: 96.3 fL (ref 80.0–100.0)
Platelets: 222 10*3/uL (ref 150–400)
RBC: 3.83 MIL/uL — ABNORMAL LOW (ref 3.87–5.11)
RDW: 12.5 % (ref 11.5–15.5)
WBC: 11.5 10*3/uL — ABNORMAL HIGH (ref 4.0–10.5)
nRBC: 0 % (ref 0.0–0.2)

## 2022-05-03 MED ORDER — TRAZODONE HCL 50 MG PO TABS
50.0000 mg | ORAL_TABLET | Freq: Once | ORAL | Status: AC
  Administered 2022-05-03: 50 mg via ORAL
  Filled 2022-05-03: qty 1

## 2022-05-03 MED ORDER — CARVEDILOL 6.25 MG PO TABS
6.2500 mg | ORAL_TABLET | Freq: Two times a day (BID) | ORAL | Status: DC
  Administered 2022-05-03 – 2022-05-04 (×2): 6.25 mg via ORAL
  Filled 2022-05-03 (×2): qty 1

## 2022-05-03 MED ORDER — PRAVASTATIN SODIUM 20 MG PO TABS
20.0000 mg | ORAL_TABLET | Freq: Every day | ORAL | Status: DC
  Administered 2022-05-03: 20 mg via ORAL
  Filled 2022-05-03: qty 1

## 2022-05-03 MED ORDER — ENOXAPARIN SODIUM 40 MG/0.4ML IJ SOSY
40.0000 mg | PREFILLED_SYRINGE | INTRAMUSCULAR | Status: DC
  Administered 2022-05-03: 40 mg via SUBCUTANEOUS
  Filled 2022-05-03: qty 0.4

## 2022-05-03 MED ORDER — CITALOPRAM HYDROBROMIDE 20 MG PO TABS
40.0000 mg | ORAL_TABLET | Freq: Every day | ORAL | Status: DC
  Administered 2022-05-03 – 2022-05-04 (×2): 40 mg via ORAL
  Filled 2022-05-03 (×2): qty 2

## 2022-05-03 NOTE — ED Notes (Signed)
Pt is refusing the NGT, this RN attempted 3 times and pt physically pulled it out of my hand. I will send a message to the provider. Inpatient RN notified.

## 2022-05-03 NOTE — Progress Notes (Signed)
Brief Progress Note Patient presenting to ED on 04/29 for abdominal pain, chest pain, nausea. Work up concerning for possible partial SBO. History of abdominal hysterectomy. No previous obstructions. Was admitted to the hospitalist. Refusing NGT  This AM, she reports feeling much better. Abdominal pain resolved. No nausea emesis. Abdominal examination is benign. Dry mucus membranes. No flatus or BM  Plan NPO + IVF Okay to hold off on NGT No surgical intervention at this time Serial KUB Monitor abdominal examination; ongoing bowel function Pain control prn; antiemetics prn Mobilize  Full consult to follow  -- Lynden Oxford, PA-C [facility] 05/03/2022, 9:32 AM M-F: 7am - 4pm

## 2022-05-03 NOTE — Progress Notes (Addendum)
PROGRESS NOTE    Madeline Cabrera  WRU:[id number] DOB: Sep 19, 1945 DOA: 05/02/2022 PCP: Lauro Regulus, MD     Brief Narrative:   From admission h and p  Ms. Madeline Cabrera is a 77 year old female with history of depression, hypertension, hyperlipidemia, lumbar radiculitis lumbar stenosis, bipolar 1 disorder, bilateral carotid artery stenosis, CAD, dementia, CKD stage IIIb, history of hysterectomy, who presents emergency department for chief concerns of abdominal pain, chest pain, shoulder pain.   At bedside, patient was able to tell me her name, age, location, current calendar year.   She reports that today she started having severe abdominal pain, 10 out of 10 to her abdomen and chest discomfort.  She denies trauma to her person.  She endorses persistent nausea and gagging sensation.  She denies vomiting.  She denies dysuria, hematuria, diarrhea.  She reports she had a small bowel movement today at home.  She reports she is never had pain like this before.  She reports that she had a hysterectomy many many many years ago.   She endorses decreased appetite.  She reports that she last ate a few hours ago when her husband brought her some fries and an egg sandwich which she ate very little of.   Assessment & Plan:   Principal Problem:   Small bowel obstruction (HCC) Active Problems:   Essential hypertension   Hyperlipidemia   CAD (coronary artery disease), native coronary artery   Depression   CKD stage 3b, GFR 30-44 ml/min (HCC)   Dementia arising in the senium and presenium (HCC)  # Partial SBO Possibly mechanical, does have hx hysterectomy. No prior hx of this. Pain much improved, no vomiting, but no stool or flatus since yesterday. Gen surg following. Vitals and labs stable. - NPO today, per gen surg, if continues to improve likely advance diet tomorrow - no NG tube currently - continue IVF - anti-emetic  # Chest pain # CAD Hx of stent. Current pain likely 2/2  above. Resolved. Trops neg. CTA negative - monitor - home asa on hold - cont home coreg  # CAD Hx of stent - cont home statin  # Bipolar - cont home citalopram  # HTN Bp wnl - holding home losartan for now     DVT prophylaxis: lovenox Code Status: full Family Communication: husband updated @ bedside  Level of care: Telemetry Surgical Status is: Inpatient Remains inpatient appropriate because: severity of illness    Consultants:  Gen surg  Procedures: none  Antimicrobials:  none    Subjective: Chest pain resolved, abdominal pain mostly resolved, no vomiting, no flatus/bm since yesterday  Objective: Vitals:   05/03/22 0049 05/03/22 0421 05/03/22 0550 05/03/22 0751  BP: (!) 183/67 (!) 130/99 (!) 153/55 (!) 143/57  Pulse: 66 71 75 76  Resp: 18 16  16   Temp: 98.1 F (36.7 C) 98.1 F (36.7 C)  98 F (36.7 C)  TempSrc: Oral   Oral  SpO2: 99% 97%  97%  Weight: 62.8 kg     Height: 5\' 2"  (1.575 m)       Intake/Output Summary (Last 24 hours) at 05/03/2022 1018 Last data filed at 05/03/2022 0506 Gross per 24 hour  Intake 307.42 ml  Output --  Net 307.42 ml   Filed Weights   05/02/22 2003 05/03/22 0049  Weight: 68 kg 62.8 kg    Examination:  General exam: Appears calm and comfortable  Respiratory system: Clear to auscultation. Respiratory effort normal. Cardiovascular system: S1 & S2 heard,  RRR. No JVD, murmurs, rubs, gallops or clicks. No pedal edema. Gastrointestinal system: Abdomen is nondistended, soft and mildly tender throughout. No organomegaly or masses felt. Normal bowel sounds heard. Central nervous system: Alert and oriented. No focal neurological deficits. Extremities: Symmetric 5 x 5 power. Skin: No rashes, lesions or ulcers Psychiatry: Judgement and insight appear normal. Mood & affect appropriate.     Data Reviewed: I have personally reviewed following labs and imaging studies  CBC: Recent Labs  Lab 05/02/22 2005 05/03/22 0454   WBC 12.8* 11.5*  NEUTROABS 8.1*  --   HGB 13.3 12.3  HCT 39.8 36.9  MCV 95.2 96.3  PLT 261 222   Basic Metabolic Panel: Recent Labs  Lab 05/02/22 2005 05/03/22 0454  NA 137 138  K 3.9 4.2  CL 107 110  CO2 22 23  GLUCOSE 98 108*  BUN 33* 26*  CREATININE 1.34* 1.09*  CALCIUM 9.7 8.6*   GFR: Estimated Creatinine Clearance: 37.1 mL/min (A) (by C-G formula based on SCr of 1.09 mg/dL (H)). Liver Function Tests: Recent Labs  Lab 05/02/22 2005  AST 26  ALT 18  ALKPHOS 65  BILITOT 0.8  PROT 7.0  ALBUMIN 4.1   Recent Labs  Lab 05/02/22 2005  LIPASE 28   No results for input(s): "AMMONIA" in the last 168 hours. Coagulation Profile: No results for input(s): "INR", "PROTIME" in the last 168 hours. Cardiac Enzymes: No results for input(s): "CKTOTAL", "CKMB", "CKMBINDEX", "TROPONINI" in the last 168 hours. BNP (last 3 results) No results for input(s): "PROBNP" in the last 8760 hours. HbA1C: No results for input(s): "HGBA1C" in the last 72 hours. CBG: No results for input(s): "GLUCAP" in the last 168 hours. Lipid Profile: No results for input(s): "CHOL", "HDL", "LDLCALC", "TRIG", "CHOLHDL", "LDLDIRECT" in the last 72 hours. Thyroid Function Tests: No results for input(s): "TSH", "T4TOTAL", "FREET4", "T3FREE", "THYROIDAB" in the last 72 hours. Anemia Panel: No results for input(s): "VITAMINB12", "FOLATE", "FERRITIN", "TIBC", "IRON", "RETICCTPCT" in the last 72 hours. Urine analysis:    Component Value Date/Time   COLORURINE Yellow 04/03/2011 1422   APPEARANCEUR Hazy 04/03/2011 1422   LABSPEC 1.020 04/03/2011 1422   PHURINE 5.0 04/03/2011 1422   GLUCOSEU Negative 04/03/2011 1422   HGBUR Negative 04/03/2011 1422   BILIRUBINUR Negative 04/03/2011 1422   KETONESUR Negative 04/03/2011 1422   PROTEINUR Negative 04/03/2011 1422   NITRITE Negative 04/03/2011 1422   LEUKOCYTESUR Negative 04/03/2011 1422   Sepsis Labs: @LABRCNTIP (procalcitonin:4,lacticidven:4)  )No  results found for this or any previous visit (from the past 240 hour(s)).       Radiology Studies: CT Angio Chest/Abd/Pel for Dissection W and/or Wo Contrast  Result Date: 05/02/2022 CLINICAL DATA:  Acute aortic syndrome (AAS) suspected EXAM: CT ANGIOGRAPHY CHEST, ABDOMEN AND PELVIS TECHNIQUE: Non-contrast CT of the chest was initially obtained. Multidetector CT imaging through the chest, abdomen and pelvis was performed using the standard protocol during bolus administration of intravenous contrast. Multiplanar reconstructed images and MIPs were obtained and reviewed to evaluate the vascular anatomy. RADIATION DOSE REDUCTION: This exam was performed according to the departmental dose-optimization program which includes automated exposure control, adjustment of the mA and/or kV according to patient size and/or use of iterative reconstruction technique. CONTRAST:  80mL OMNIPAQUE IOHEXOL 350 MG/ML SOLN COMPARISON:  None Available. FINDINGS: CTA CHEST FINDINGS Cardiovascular: Preferential opacification of the thoracic aorta. No evidence of thoracic aortic aneurysm or dissection. Normal heart size. No significant pericardial effusion. The thoracic aorta is normal in caliber. Mild atherosclerotic plaque  of the thoracic aorta. Four vessel coronary artery calcifications. The main pulmonary artery is normal in caliber. No central pulmonary embolus. Mediastinum/Nodes: No enlarged mediastinal, hilar, or axillary lymph nodes. Thyroid gland, trachea, and esophagus demonstrate no significant findings. Small hiatal hernia. Lungs/Pleura: No focal consolidation. No pulmonary nodule. No pulmonary mass. No pleural effusion. No pneumothorax. Musculoskeletal: No chest wall abnormality. No suspicious lytic or blastic osseous lesions. No acute displaced fracture. Old healed right humeral fracture. Review of the MIP images confirms the above findings. CTA ABDOMEN AND PELVIS FINDINGS VASCULAR Aorta: Moderate to severe  atherosclerotic plaque. Normal caliber aorta without aneurysm, dissection, vasculitis or significant stenosis. Celiac: Patent without evidence of aneurysm, dissection, vasculitis or significant stenosis. SMA: Replaced right hepatic artery off of the SMA. Patent without evidence of aneurysm, dissection, vasculitis or significant stenosis. Renals: Both renal arteries are patent without evidence of aneurysm, dissection, vasculitis, fibromuscular dysplasia or significant stenosis. IMA: Mild atherosclerotic plaque. Patent without evidence of aneurysm, dissection, vasculitis or significant stenosis. Inflow: Patent without evidence of aneurysm, dissection, vasculitis or significant stenosis. Veins: No obvious venous abnormality within the limitations of this arterial phase study. Review of the MIP images confirms the above findings. NON-VASCULAR Hepatobiliary: No focal liver abnormality. No gallstones, gallbladder wall thickening, or pericholecystic fluid. No biliary dilatation. Pancreas: Diffusely atrophic. No focal lesion. Otherwise normal pancreatic contour. No surrounding inflammatory changes. No main pancreatic ductal dilatation. Spleen: Normal in size without focal abnormality. Adrenals/Urinary Tract: No adrenal nodule bilaterally. Bilateral kidneys enhance symmetrically. Punctate right nephrolithiasis. No left nephrolithiasis. No ureterolithiasis bilaterally. No hydronephrosis. No hydroureter. The urinary bladder is unremarkable. Stomach/Bowel: Stomach is within normal limits. Several loops of proximal to mid small bowel dilated up to 3.1 cm with fluid. No evidence of large bowel wall thickening or dilatation. Stool throughout the colon. Colonic diverticulosis. Status post appendectomy. Lymphatic: No lymphadenopathy. Reproductive: Status post hysterectomy. No adnexal masses. Other: No intraperitoneal free fluid. No intraperitoneal free gas. No organized fluid collection. Musculoskeletal: No abdominal wall hernia or  abnormality. No suspicious lytic or blastic osseous lesions. No acute displaced fracture. L5-S1 posterolateral and interbody surgical hardware. Review of the MIP images confirms the above findings. IMPRESSION: 1. No acute thoracic or abdominal aorta abnormality. 2. Aortic Atherosclerosis (ICD10-I70.0) including four-vessel coronary calcification. 3. Early/partial proximal to mid small bowel obstruction with it developing transition point within the left lower mid abdomen. 4. Colonic diverticulosis with no acute diverticulitis. 5. Stool throughout the colon-correlate for constipation. 6. Small hiatal hernia. 7. Nonobstructive punctate right nephrolithiasis. Electronically Signed   By: Tish Frederickson M.D.   On: 05/02/2022 22:15   DG Chest 1 View  Result Date: 05/02/2022 CLINICAL DATA:  [id number] Chest pain [id number] EXAM: CHEST  1 VIEW COMPARISON:  Chest x-ray 04/19/2018, CT chest 09/09/2020 FINDINGS: The heart and mediastinal contours are within normal limits. Aortic calcification. No focal consolidation. No pulmonary edema. No pleural effusion. No pneumothorax. No acute osseous abnormality. IMPRESSION: 1. No active disease. 2.  Aortic Atherosclerosis (ICD10-I70.0). Electronically Signed   By: Tish Frederickson M.D.   On: 05/02/2022 20:38        Scheduled Meds: Continuous Infusions:  lactated ringers 150 mL/hr at 05/03/22 0824   promethazine (PHENERGAN) injection (IM or IVPB)       LOS: 1 day     Silvano Bilis, MD [facility]   If 7PM-7AM, please contact night-coverage [email or web address] Password [email or web address] 05/03/2022, 10:18 AM

## 2022-05-03 NOTE — Consult Note (Signed)
[facility] SURGICAL CONSULTATION NOTE (initial) - cpt: 09811   HISTORY OF PRESENT ILLNESS (HPI):  77 y.o. female presented to [facility] ED yesterday for evaluation of abdominal pain and chest pain. Patient reports the acute onset of central abdominal pain which then radiated up towards her chest. This was accompanied by nausea and gagging. No fever, chills, cough, SOB, emesis, or urinary changes. She has denied any flatus nor BM. No history of previous bowel obstructions. Previous intra-abdominal surgery positive for abdominal hysterectomy. Work up in the ED revealed a mild leukocytosis to 12.8K (now 11.5K), slight AKI with sCr - 1.34, no electrolyte derangements, and high sensitivity troponin was normal x2. She did get CTA which was concerning for possible partial SBO. She was admitted to the medicine service. NGT was ordered but patient declined.    Surgery is consulted by emergency medicine physician Dr. Willy Eddy, MD in this context for evaluation and management of SBO.  This afternoon, she reports she is feeling better. Abdominal pain resolved. No longer with nausea. Still without flatus.   PAST MEDICAL HISTORY (PMH):  Past Medical History:  Diagnosis Date   Anxiety    Arthritis    OSTEOARTHRITIS   Bipolar disorder (HCC)    Chronic fatigue syndrome    Constipation, chronic    Coronary artery disease    GERD (gastroesophageal reflux disease)    Headache(784.0)    Hx of basal cell carcinoma 05/15/2018   L upper lip below nasolabial. Excised 10/03/2018, margins free.   Hx of dysplastic nevus 03/12/2018   L medial shoulder, severe atypia   Hyperlipidemia    Hypertension    Iritis    Lumbar stenosis    Osteoporosis    Personal history of colonic polyps    PONV (postoperative nausea and vomiting)    Renal insufficiency    Stroke (HCC)    TIAs x2     PAST SURGICAL HISTORY (PSH):  Past Surgical History:  Procedure Laterality Date   ABDOMINAL HYSTERECTOMY      ACHILLES TENDON REPAIR Left    ARTHRODESIS METATARSALPHALANGEAL JOINT (MTPJ) Left 01/09/2015   Procedure: ARTHRODESIS METATARSALPHALANGEAL JOINT (MTPJ);  Surgeon: Recardo Evangelist, DPM;  Location: [facility] ORS;  Service: Podiatry;  Laterality: Left;   BACK SURGERY     LUMBAR SPINE FUSION   CARDIAC CATHETERIZATION     CATARACT EXTRACTION Bilateral    COLONOSCOPY WITH PROPOFOL N/A 04/10/2019   Procedure: COLONOSCOPY WITH PROPOFOL;  Surgeon: Toledo, Boykin Nearing, MD;  Location: [facility] ENDOSCOPY;  Service: Gastroenterology;  Laterality: N/A;   CORONARY ANGIOPLASTY WITH STENT PLACEMENT     ESOPHAGOGASTRODUODENOSCOPY (EGD) WITH PROPOFOL N/A 04/10/2019   Procedure: ESOPHAGOGASTRODUODENOSCOPY (EGD) WITH PROPOFOL;  Surgeon: Toledo, Boykin Nearing, MD;  Location: [facility] ENDOSCOPY;  Service: Gastroenterology;  Laterality: N/A;   FRACTURE SURGERY     RT.SHOULDER FRACTURE REPAIR   heel spurs Right    HEMORRHOID SURGERY     TRIGGER FINGER RELEASE Bilateral      MEDICATIONS:  Prior to Admission medications   Medication Sig Start Date End Date Taking? Authorizing Provider  aspirin 81 MG tablet Take 81 mg by mouth daily.   Yes [provider]  buPROPion (WELLBUTRIN XL) 150 MG 24 hr tablet Take 150 mg by mouth daily. 02/23/22  Yes [provider]  carvedilol (COREG) 6.25 MG tablet Take 6.25 mg by mouth 2 (two) times daily with a meal. 02/07/22 02/07/23 Yes [provider]  cetirizine (ZYRTEC) 10 MG tablet Take 10 mg by mouth daily.  Yes [provider]  cholecalciferol (VITAMIN D) 1000 UNITS tablet Take 1,000 Units by mouth daily.   Yes [provider]  citalopram (CELEXA) 40 MG tablet Take 40 mg by mouth daily. 03/14/22  Yes [provider]  clobetasol (TEMOVATE) 0.05 % external solution Apply twice daily to affected areas as needed for itching. Avoid applying to face, groin, and axilla. 08/04/21  Yes Willeen Niece, MD  Dexlansoprazole (DEXILANT) 30 MG capsule Take 30 mg by mouth  daily.   Yes [provider]  DiphenhydrAMINE HCl (ALLERGY MEDICATION PO) Take 1 tablet by mouth every evening.    Yes [provider]  estrogens, conjugated, (PREMARIN) 1.25 MG tablet Take 1.25 mg by mouth every 7 (seven) days. Reported on 12/24/2014   Yes [provider]  losartan (COZAAR) 25 MG tablet Take 25 mg by mouth daily.   Yes [provider]  tacrolimus (PROTOPIC) 0.1 % ointment Apply topically 2 (two) times daily. 09/20/21  Yes Willeen Niece, MD  traMADol (ULTRAM) 50 MG tablet Take 50-100 mg by mouth every 6 (six) hours as needed for moderate pain.   Yes [provider]  traZODone (DESYREL) 50 MG tablet Take 50 mg by mouth at bedtime. 04/25/22  Yes [provider]  White Petrolatum-Mineral Oil (GENTEAL PM) 85-15 % OINT Apply 1 application to eye every evening.   Yes [provider]  Hypromellose (GENTEAL OP) Apply 2 drops to eye 2 (two) times daily. Patient not taking: Reported on 05/03/2022    [provider]     ALLERGIES:  Allergies  Allergen Reactions   Lovastatin     Other Reaction(s): Muscle Pain   Clindamycin/Lincomycin     UNKNOWN   Doxycycline Nausea Only   Hydrocodone-Guaifenesin     UNKNOWN   Minocycline Nausea Only   Naprosyn [Naproxen]     UNKNOWN    Biaxin [Clarithromycin] Rash   Penicillins Rash     SOCIAL HISTORY:  Social History   Socioeconomic History   Marital status: Married    Spouse name: Not on file   Number of children: Not on file   Years of education: Not on file   Highest education level: Not on file  Occupational History   Not on file  Tobacco Use   Smoking status: Former    Packs/day: 0.25    Years: 2.00    Additional pack years: 0.00    Total pack years: 0.50    Types: Cigarettes    Quit date: 01/03/2002    Years since quitting: 20.3   Smokeless tobacco: Never  Vaping Use   Vaping Use: Never used  Substance and Sexual Activity   Alcohol use: No   Drug  use: No   Sexual activity: Not Currently  Other Topics Concern   Not on file  Social History Narrative   Not on file   Social Determinants of Health   Financial Resource Strain: Not on file  Food Insecurity: No Food Insecurity (05/03/2022)   Hunger Vital Sign    Worried About Running Out of Food in the Last Year: Never true    Ran Out of Food in the Last Year: Never true  Transportation Needs: No Transportation Needs (05/03/2022)   PRAPARE - Administrator, Civil Service (Medical): No    Lack of Transportation (Non-Medical): No  Physical Activity: Not on file  Stress: Not on file  Social Connections: Not on file  Intimate Partner Violence: Not At Risk (05/03/2022)  Humiliation, Afraid, Rape, and Kick questionnaire    Fear of Current or Ex-Partner: No    Emotionally Abused: No    Physically Abused: No    Sexually Abused: No     FAMILY HISTORY:  Family History  Problem Relation Age of Onset   Diabetes Mother    Heart disease Mother    Hypertension Brother    Heart disease Brother    Breast cancer Neg Hx       REVIEW OF SYSTEMS:  Review of Systems  Constitutional:  Negative for chills and fever.  Respiratory:  Negative for cough and shortness of breath.   Cardiovascular:  Positive for chest pain. Negative for palpitations.  Gastrointestinal:  Positive for abdominal pain and nausea. Negative for constipation, diarrhea and vomiting.  Genitourinary:  Negative for dysuria and urgency.  All other systems reviewed and are negative.   VITAL SIGNS:  Temp:  [98 F (36.7 C)-98.3 F (36.8 C)] 98 F (36.7 C) (04/30 0751) Pulse Rate:  [57-76] 76 (04/30 0751) Resp:  [15-22] 16 (04/30 0751) BP: (105-192)/(55-99) 143/57 (04/30 0751) SpO2:  [97 %-100 %] 97 % (04/30 0751) Weight:  [62.8 kg-68 kg] 62.8 kg (04/30 0049)     Height: 5\' 2"  (157.5 cm) Weight: 62.8 kg BMI (Calculated): 25.32   INTAKE/OUTPUT:  04/29 0701 - 04/30 0700 In: 307.4 [I.V.:307.4] Out: -    PHYSICAL EXAM:  Physical Exam Vitals and nursing note reviewed. Exam conducted with a chaperone present.  Constitutional:      General: She is not in acute distress.    Appearance: Normal appearance. She is not ill-appearing.  HENT:     Head: Normocephalic and atraumatic.     Mouth/Throat:     Mouth: Mucous membranes are dry.     Pharynx: Oropharynx is clear.  Eyes:     General: No scleral icterus.    Conjunctiva/sclera: Conjunctivae normal.  Cardiovascular:     Rate and Rhythm: Normal rate.     Pulses: Normal pulses.     Heart sounds: No murmur heard. Pulmonary:     Effort: Pulmonary effort is normal. No respiratory distress.  Genitourinary:    Comments: Deferred Skin:    General: Skin is warm and dry.     Coloration: Skin is not pale.     Findings: No erythema.  Neurological:     General: No focal deficit present.     Mental Status: She is alert and oriented to person, place, and time.  Psychiatric:        Mood and Affect: Mood normal.        Behavior: Behavior normal.      Labs:     Latest Ref Rng & Units 05/03/2022    4:54 AM 05/02/2022    8:05 PM 04/19/2018    4:24 PM  CBC  WBC 4.0 - 10.5 K/uL 11.5  12.8  8.8   Hemoglobin 12.0 - 15.0 g/dL 16.1  09.6  04.5   Hematocrit 36.0 - 46.0 % 36.9  39.8  38.3   Platelets 150 - 400 K/uL 222  261  247       Latest Ref Rng & Units 05/03/2022    4:54 AM 05/02/2022    8:05 PM 04/19/2018    4:24 PM  CMP  Glucose 70 - 99 mg/dL 409  98  811   BUN 8 - 23 mg/dL 26  33  24   Creatinine 0.44 - 1.00 mg/dL 9.14  7.82  9.56   Sodium  135 - 145 mmol/L 138  137  137   Potassium 3.5 - 5.1 mmol/L 4.2  3.9  4.1   Chloride 98 - 111 mmol/L 110  107  108   CO2 22 - 32 mmol/L 23  22  22    Calcium 8.9 - 10.3 mg/dL 8.6  9.7  9.4   Total Protein 6.5 - 8.1 g/dL  7.0  6.9   Total Bilirubin 0.3 - 1.2 mg/dL  0.8  0.4   Alkaline Phos 38 - 126 U/L  65  60   AST 15 - 41 U/L  26  22   ALT 0 - 44 U/L  18  14     Imaging studies:   CTA  Chest/Abdomen/Pelvis (05/02/2022) personally reviewed with gastric distension, small bowel distension with somewhat of transition in the left abdomen, no pneumoperitoneum nor pneumatosis, and radiologist report reviewed below:  IMPRESSION: 1. No acute thoracic or abdominal aorta abnormality. 2. Aortic Atherosclerosis (ICD10-I70.0) including four-vessel coronary calcification. 3. Early/partial proximal to mid small bowel obstruction with it developing transition point within the left lower mid abdomen. 4. Colonic diverticulosis with no acute diverticulitis. 5. Stool throughout the colon-correlate for constipation. 6. Small hiatal hernia. 7. Nonobstructive punctate right nephrolithiasis.   Assessment/Plan: (ICD-10's: K45.609) 77 y.o. female with likely resolving partial SBO most likely secondary to post-surgical adhesions   - NPO for today + IVF; If doing better in the AM, can do CLD - Okay to hold off on NGT; If nausea/emesis return would strongly recommend this - No surgical intervention at this time - Serial KUB; ordered for AM - Monitor abdominal examination; ongoing bowel function - Pain control prn; antiemetics prn - Mobilize  - Further management per primary service; we will follow   All of the above findings and recommendations were discussed with the patient, and all of patient's questions were answered to her expressed satisfaction.  Thank you for the opportunity to participate in this patient's care.   -- Lynden Oxford, PA-C [facility] 05/03/2022, 1:39 PM M-F: 7am - 4pm

## 2022-05-03 NOTE — Progress Notes (Signed)
  Transition of Care [facility]) Screening Note   Patient Details  Name: Madeline Cabrera Date of Birth: [birth year]   Transition of Care [facility]) CM/SW Contact:    Chapman Fitch, RN Phone Number: 05/03/2022, 10:08 AM    Transition of Care Department [facility]) has reviewed patient and no TOC needs have been identified at this time. We will continue to monitor patient advancement through interdisciplinary progression rounds. If new patient transition needs arise, please place a TOC consult.

## 2022-05-03 NOTE — ED Notes (Signed)
[place] NP notified and new orders placed. Inpatient RN aware and pt is ok to transport to the floor.

## 2022-05-04 ENCOUNTER — Inpatient Hospital Stay: Payer: PPO

## 2022-05-04 DIAGNOSIS — I10 Essential (primary) hypertension: Secondary | ICD-10-CM | POA: Diagnosis not present

## 2022-05-04 DIAGNOSIS — K56609 Unspecified intestinal obstruction, unspecified as to partial versus complete obstruction: Secondary | ICD-10-CM | POA: Diagnosis not present

## 2022-05-04 DIAGNOSIS — F319 Bipolar disorder, unspecified: Secondary | ICD-10-CM | POA: Diagnosis not present

## 2022-05-04 DIAGNOSIS — K56600 Partial intestinal obstruction, unspecified as to cause: Secondary | ICD-10-CM

## 2022-05-04 LAB — BASIC METABOLIC PANEL
Anion gap: 5 (ref 5–15)
BUN: 17 mg/dL (ref 8–23)
CO2: 22 mmol/L (ref 22–32)
Calcium: 9 mg/dL (ref 8.9–10.3)
Chloride: 114 mmol/L — ABNORMAL HIGH (ref 98–111)
Creatinine, Ser: 1.04 mg/dL — ABNORMAL HIGH (ref 0.44–1.00)
GFR, Estimated: 55 mL/min — ABNORMAL LOW (ref >60)
Glucose, Bld: 83 mg/dL (ref 70–99)
Potassium: 4 mmol/L (ref 3.5–5.1)
Sodium: 141 mmol/L (ref 135–145)

## 2022-05-04 NOTE — Progress Notes (Signed)
.  jack 

## 2022-05-04 NOTE — Discharge Summary (Signed)
Physician Discharge Summary  [name] RUE:[id number] DOB: Dec 30, 1945 DOA: 05/02/2022  PCP: Lauro Regulus, MD  Admit date: 05/02/2022 Discharge date: 05/04/2022  Admitted From: home  Disposition:  home   Recommendations for Outpatient Follow-up:  Follow up with PCP in 1-2 weeks   Home Health: no  Equipment/Devices:  Discharge Condition: stable  CODE STATUS: full  Diet recommendation: Heart Healthy   Brief/Interim Summary: HPI was taken from Dr. Sedalia Muta: Ms. Madeline Cabrera is a 77 year old female with history of depression, hypertension, hyperlipidemia, lumbar radiculitis lumbar stenosis, bipolar 1 disorder, bilateral carotid artery stenosis, CAD, dementia, CKD stage IIIb, history of hysterectomy, who presents emergency department for chief concerns of abdominal pain, chest pain, shoulder pain.   Vitals in the ED showed temperature of 98.3, respiration rate of 17, heart rate of 58, blood pressure 156/98, SpO2 of 100% on room air.   Serum sodium is 137, potassium 3.9, chloride 107, bicarb 22, BUN of 33, serum creatinine 1.34, EGFR 41, nonfasting blood glucose 98, WBC 12.8, hemoglobin 13.3, platelets of 261.   High sensitive troponin was 11 and on repeat was 11.   Patient is persistent nausea and vomiting, NG tube ordered for placement by EDP.   ED treatment: Ondansetron 4 mg p.o. one-time dose, morphine 4 mg IV, EDP consulted general surgery, Dr. Molly Maduro who recommends hospitalist admission. -------------------------- At bedside, patient was able to tell me her name, age, location, current calendar year.   She reports that today she started having severe abdominal pain, 10 out of 10 to her abdomen and chest discomfort.  She denies trauma to her person.  She endorses persistent nausea and gagging sensation.  She denies vomiting.  She denies dysuria, hematuria, diarrhea.  She reports she had a small bowel movement today at home.  She reports she is never had pain like this  before.  She reports that she had a hysterectomy many many many years ago.   She endorses decreased appetite.  She reports that she last ate a few hours ago when her husband brought her some fries and an egg sandwich which she ate very little of.  As per Dr. Mayford Knife 05/04/22: The partial SBO was resolving and pt was tolerating a po diet prior to d/c.   Discharge Diagnoses:  Principal Problem:   Small bowel obstruction (HCC) Active Problems:   Essential hypertension   Hyperlipidemia   CAD (coronary artery disease), native coronary artery   Depression   CKD stage 3b, GFR 30-44 ml/min (HCC)   Dementia arising in the senium and presenium (HCC)  Partial SBO: resolving. Tolerating po diet.   Chest pain: troponins neg. CTA neg. Hx of CAD. Resolved    Hx of CAD: w/ hx of stent. Continue on statin   Bipolar: unknown severity or type. Continue on home dose of citalopram    HTN: continue on home dose of losartan    Discharge Instructions  Discharge Instructions     Diet - low sodium heart healthy   Complete by: As directed    Start with a soft diet and advance as tolerated   Discharge instructions   Complete by: As directed    F/u w/ PCP in 1-2 weeks   Increase activity slowly   Complete by: As directed       Allergies as of 05/04/2022       Reactions   Lovastatin    Other Reaction(s): Muscle Pain   Clindamycin/lincomycin    UNKNOWN   Doxycycline Nausea Only  Hydrocodone-guaifenesin    UNKNOWN   Minocycline Nausea Only   Naprosyn [naproxen]    UNKNOWN   Biaxin [clarithromycin] Rash   Penicillins Rash        Medication List     STOP taking these medications    estrogens (conjugated) 1.25 MG tablet Commonly known as: PREMARIN   GENTEAL OP   tacrolimus 0.1 % ointment Commonly known as: PROTOPIC       TAKE these medications    ALLERGY MEDICATION PO Take 1 tablet by mouth every evening.   aspirin 81 MG tablet Take 81 mg by mouth daily.   buPROPion  150 MG 24 hr tablet Commonly known as: WELLBUTRIN XL Take 150 mg by mouth daily.   carvedilol 6.25 MG tablet Commonly known as: COREG Take 6.25 mg by mouth 2 (two) times daily with a meal.   cetirizine 10 MG tablet Commonly known as: ZYRTEC Take 10 mg by mouth daily.   cholecalciferol 1000 units tablet Commonly known as: VITAMIN D Take 1,000 Units by mouth daily.   citalopram 40 MG tablet Commonly known as: CELEXA Take 40 mg by mouth daily.   clobetasol 0.05 % external solution Commonly known as: TEMOVATE Apply twice daily to affected areas as needed for itching. Avoid applying to face, groin, and axilla.   Dexilant 30 MG capsule DR Generic drug: Dexlansoprazole Take 30 mg by mouth daily.   GenTeal PM 85-15 % Oint Apply 1 application to eye every evening.   losartan 25 MG tablet Commonly known as: COZAAR Take 25 mg by mouth daily.   Magnesium 250 MG Tabs Take 250 mg by mouth daily.   pravastatin 20 MG tablet Commonly known as: PRAVACHOL Take 20 mg by mouth at bedtime.   traMADol 50 MG tablet Commonly known as: ULTRAM Take 50-100 mg by mouth every 6 (six) hours as needed for moderate pain.   traZODone 50 MG tablet Commonly known as: DESYREL Take 50 mg by mouth at bedtime.        Allergies  Allergen Reactions   Lovastatin     Other Reaction(s): Muscle Pain   Clindamycin/Lincomycin     UNKNOWN   Doxycycline Nausea Only   Hydrocodone-Guaifenesin     UNKNOWN   Minocycline Nausea Only   Naprosyn [Naproxen]     UNKNOWN    Biaxin [Clarithromycin] Rash   Penicillins Rash    Consultations: Gen surg    Procedures/Studies: DG ABD ACUTE 2+V W 1V CHEST  Result Date: 05/04/2022 CLINICAL DATA:  Small bowel obstruction. EXAM: DG ABDOMEN ACUTE WITH 1 VIEW CHEST COMPARISON:  May 02, 2022. FINDINGS: There is no evidence of dilated bowel loops or free intraperitoneal air. Moderate amount of stool seen throughout the colon. No radiopaque calculi or other  significant radiographic abnormality is seen. Heart size and mediastinal contours are within normal limits. Both lungs are clear. IMPRESSION: Moderate stool burden. No abnormal bowel dilatation. No acute cardiopulmonary disease. Electronically Signed   By: Lupita Raider M.D.   On: 05/04/2022 08:10   CT Angio Chest/Abd/Pel for Dissection W and/or Wo Contrast  Result Date: 05/02/2022 CLINICAL DATA:  Acute aortic syndrome (AAS) suspected EXAM: CT ANGIOGRAPHY CHEST, ABDOMEN AND PELVIS TECHNIQUE: Non-contrast CT of the chest was initially obtained. Multidetector CT imaging through the chest, abdomen and pelvis was performed using the standard protocol during bolus administration of intravenous contrast. Multiplanar reconstructed images and MIPs were obtained and reviewed to evaluate the vascular anatomy. RADIATION DOSE REDUCTION: This exam was performed according  to the departmental dose-optimization program which includes automated exposure control, adjustment of the mA and/or kV according to patient size and/or use of iterative reconstruction technique. CONTRAST:  80mL OMNIPAQUE IOHEXOL 350 MG/ML SOLN COMPARISON:  None Available. FINDINGS: CTA CHEST FINDINGS Cardiovascular: Preferential opacification of the thoracic aorta. No evidence of thoracic aortic aneurysm or dissection. Normal heart size. No significant pericardial effusion. The thoracic aorta is normal in caliber. Mild atherosclerotic plaque of the thoracic aorta. Four vessel coronary artery calcifications. The main pulmonary artery is normal in caliber. No central pulmonary embolus. Mediastinum/Nodes: No enlarged mediastinal, hilar, or axillary lymph nodes. Thyroid gland, trachea, and esophagus demonstrate no significant findings. Small hiatal hernia. Lungs/Pleura: No focal consolidation. No pulmonary nodule. No pulmonary mass. No pleural effusion. No pneumothorax. Musculoskeletal: No chest wall abnormality. No suspicious lytic or blastic osseous  lesions. No acute displaced fracture. Old healed right humeral fracture. Review of the MIP images confirms the above findings. CTA ABDOMEN AND PELVIS FINDINGS VASCULAR Aorta: Moderate to severe atherosclerotic plaque. Normal caliber aorta without aneurysm, dissection, vasculitis or significant stenosis. Celiac: Patent without evidence of aneurysm, dissection, vasculitis or significant stenosis. SMA: Replaced right hepatic artery off of the SMA. Patent without evidence of aneurysm, dissection, vasculitis or significant stenosis. Renals: Both renal arteries are patent without evidence of aneurysm, dissection, vasculitis, fibromuscular dysplasia or significant stenosis. IMA: Mild atherosclerotic plaque. Patent without evidence of aneurysm, dissection, vasculitis or significant stenosis. Inflow: Patent without evidence of aneurysm, dissection, vasculitis or significant stenosis. Veins: No obvious venous abnormality within the limitations of this arterial phase study. Review of the MIP images confirms the above findings. NON-VASCULAR Hepatobiliary: No focal liver abnormality. No gallstones, gallbladder wall thickening, or pericholecystic fluid. No biliary dilatation. Pancreas: Diffusely atrophic. No focal lesion. Otherwise normal pancreatic contour. No surrounding inflammatory changes. No main pancreatic ductal dilatation. Spleen: Normal in size without focal abnormality. Adrenals/Urinary Tract: No adrenal nodule bilaterally. Bilateral kidneys enhance symmetrically. Punctate right nephrolithiasis. No left nephrolithiasis. No ureterolithiasis bilaterally. No hydronephrosis. No hydroureter. The urinary bladder is unremarkable. Stomach/Bowel: Stomach is within normal limits. Several loops of proximal to mid small bowel dilated up to 3.1 cm with fluid. No evidence of large bowel wall thickening or dilatation. Stool throughout the colon. Colonic diverticulosis. Status post appendectomy. Lymphatic: No lymphadenopathy.  Reproductive: Status post hysterectomy. No adnexal masses. Other: No intraperitoneal free fluid. No intraperitoneal free gas. No organized fluid collection. Musculoskeletal: No abdominal wall hernia or abnormality. No suspicious lytic or blastic osseous lesions. No acute displaced fracture. L5-S1 posterolateral and interbody surgical hardware. Review of the MIP images confirms the above findings. IMPRESSION: 1. No acute thoracic or abdominal aorta abnormality. 2. Aortic Atherosclerosis (ICD10-I70.0) including four-vessel coronary calcification. 3. Early/partial proximal to mid small bowel obstruction with it developing transition point within the left lower mid abdomen. 4. Colonic diverticulosis with no acute diverticulitis. 5. Stool throughout the colon-correlate for constipation. 6. Small hiatal hernia. 7. Nonobstructive punctate right nephrolithiasis. Electronically Signed   By: Tish Frederickson M.D.   On: 05/02/2022 22:15   DG Chest 1 View  Result Date: 05/02/2022 CLINICAL DATA:  [id number] Chest pain [id number] EXAM: CHEST  1 VIEW COMPARISON:  Chest x-ray 04/19/2018, CT chest 09/09/2020 FINDINGS: The heart and mediastinal contours are within normal limits. Aortic calcification. No focal consolidation. No pulmonary edema. No pleural effusion. No pneumothorax. No acute osseous abnormality. IMPRESSION: 1. No active disease. 2.  Aortic Atherosclerosis (ICD10-I70.0). Electronically Signed   By: Tish Frederickson M.D.   On: 05/02/2022 20:38   (  Echo, Carotid, EGD, Colonoscopy, ERCP)    Subjective: Pt denies any nausea, vomiting or abd pain    Discharge Exam: Vitals:   05/04/22 0440 05/04/22 0809  BP: (!) 173/69 (!) 149/62  Pulse: 74 69  Resp: 16 19  Temp: 97.9 F (36.6 C) 97.8 F (36.6 C)  SpO2: 98% 99%   Vitals:   05/03/22 2056 05/03/22 2200 05/04/22 0440 05/04/22 0809  BP: (!) 184/65 (!) 160/54 (!) 173/69 (!) 149/62  Pulse: 64  74 69  Resp:   16 19  Temp:   97.9 F (36.6 C) 97.8 F (36.6 C)   TempSrc:   Oral   SpO2:   98% 99%  Weight:      Height:        General: Pt is alert, awake, not in acute distress Cardiovascular: S1/S2 +, no rubs, no gallops Respiratory: CTA bilaterally, no wheezing, no rhonchi Abdominal: Soft, NT, ND, bowel sounds + Extremities: no edema, no cyanosis    The results of significant diagnostics from this hospitalization (including imaging, microbiology, ancillary and laboratory) are listed below for reference.     Microbiology: No results found for this or any previous visit (from the past 240 hour(s)).   Labs: BNP (last 3 results) No results for input(s): "BNP" in the last 8760 hours. Basic Metabolic Panel: Recent Labs  Lab 05/02/22 2005 05/03/22 0454 05/04/22 0618  NA 137 138 141  K 3.9 4.2 4.0  CL 107 110 114*  CO2 22 23 22   GLUCOSE 98 108* 83  BUN 33* 26* 17  CREATININE 1.34* 1.09* 1.04*  CALCIUM 9.7 8.6* 9.0   Liver Function Tests: Recent Labs  Lab 05/02/22 2005  AST 26  ALT 18  ALKPHOS 65  BILITOT 0.8  PROT 7.0  ALBUMIN 4.1   Recent Labs  Lab 05/02/22 2005  LIPASE 28   No results for input(s): "AMMONIA" in the last 168 hours. CBC: Recent Labs  Lab 05/02/22 2005 05/03/22 0454  WBC 12.8* 11.5*  NEUTROABS 8.1*  --   HGB 13.3 12.3  HCT 39.8 36.9  MCV 95.2 96.3  PLT 261 222   Cardiac Enzymes: No results for input(s): "CKTOTAL", "CKMB", "CKMBINDEX", "TROPONINI" in the last 168 hours. BNP: Invalid input(s): "POCBNP" CBG: No results for input(s): "GLUCAP" in the last 168 hours. D-Dimer No results for input(s): "DDIMER" in the last 72 hours. Hgb A1c No results for input(s): "HGBA1C" in the last 72 hours. Lipid Profile No results for input(s): "CHOL", "HDL", "LDLCALC", "TRIG", "CHOLHDL", "LDLDIRECT" in the last 72 hours. Thyroid function studies No results for input(s): "TSH", "T4TOTAL", "T3FREE", "THYROIDAB" in the last 72 hours.  Invalid input(s): "FREET3" Anemia work up No results for input(s):  "VITAMINB12", "FOLATE", "FERRITIN", "TIBC", "IRON", "RETICCTPCT" in the last 72 hours. Urinalysis    Component Value Date/Time   COLORURINE Yellow 04/03/2011 1422   APPEARANCEUR Hazy 04/03/2011 1422   LABSPEC 1.020 04/03/2011 1422   PHURINE 5.0 04/03/2011 1422   GLUCOSEU Negative 04/03/2011 1422   HGBUR Negative 04/03/2011 1422   BILIRUBINUR Negative 04/03/2011 1422   KETONESUR Negative 04/03/2011 1422   PROTEINUR Negative 04/03/2011 1422   NITRITE Negative 04/03/2011 1422   LEUKOCYTESUR Negative 04/03/2011 1422   Sepsis Labs Recent Labs  Lab 05/02/22 2005 05/03/22 0454  WBC 12.8* 11.5*   Microbiology No results found for this or any previous visit (from the past 240 hour(s)).   Time coordinating discharge: Over 30 minutes  SIGNED:   Charise Killian, MD  [facility] 05/04/2022, 10:51 AM Pager   If 7PM-7AM, please contact night-coverage [email or web address]

## 2022-05-04 NOTE — Progress Notes (Signed)
Madeline Cabrera to be D/C'd Home per MD order.  Discussed prescriptions and follow up appointments with the patient. Prescriptions given to patient, medication list explained in detail. Pt verbalized understanding.  Allergies as of 05/04/2022       Reactions   Lovastatin    Other Reaction(s): Muscle Pain   Clindamycin/lincomycin    UNKNOWN   Doxycycline Nausea Only   Hydrocodone-guaifenesin    UNKNOWN   Minocycline Nausea Only   Naprosyn [naproxen]    UNKNOWN   Biaxin [clarithromycin] Rash   Penicillins Rash        Medication List     STOP taking these medications    estrogens (conjugated) 1.25 MG tablet Commonly known as: PREMARIN   GENTEAL OP   tacrolimus 0.1 % ointment Commonly known as: PROTOPIC       TAKE these medications    ALLERGY MEDICATION PO Take 1 tablet by mouth every evening.   aspirin 81 MG tablet Take 81 mg by mouth daily.   buPROPion 150 MG 24 hr tablet Commonly known as: WELLBUTRIN XL Take 150 mg by mouth daily.   carvedilol 6.25 MG tablet Commonly known as: COREG Take 6.25 mg by mouth 2 (two) times daily with a meal.   cetirizine 10 MG tablet Commonly known as: ZYRTEC Take 10 mg by mouth daily.   cholecalciferol 1000 units tablet Commonly known as: VITAMIN D Take 1,000 Units by mouth daily.   citalopram 40 MG tablet Commonly known as: CELEXA Take 40 mg by mouth daily.   clobetasol 0.05 % external solution Commonly known as: TEMOVATE Apply twice daily to affected areas as needed for itching. Avoid applying to face, groin, and axilla.   Dexilant 30 MG capsule DR Generic drug: Dexlansoprazole Take 30 mg by mouth daily.   GenTeal PM 85-15 % Oint Apply 1 application to eye every evening.   losartan 25 MG tablet Commonly known as: COZAAR Take 25 mg by mouth daily.   Magnesium 250 MG Tabs Take 250 mg by mouth daily.   pravastatin 20 MG tablet Commonly known as: PRAVACHOL Take 20 mg by mouth at bedtime.   traMADol 50 MG  tablet Commonly known as: ULTRAM Take 50-100 mg by mouth every 6 (six) hours as needed for moderate pain.   traZODone 50 MG tablet Commonly known as: DESYREL Take 50 mg by mouth at bedtime.        Vitals:   05/04/22 0440 05/04/22 0809  BP: (!) 173/69 (!) 149/62  Pulse: 74 69  Resp: 16 19  Temp: 97.9 F (36.6 C) 97.8 F (36.6 C)  SpO2: 98% 99%    Skin clean, dry and intact without evidence of skin break down, no evidence of skin tears noted. IV catheter discontinued intact. Site without signs and symptoms of complications. Dressing and pressure applied. Pt denies pain at this time. No complaints noted.  An After Visit Summary was printed and given to the patient. Patient escorted via WC, and D/C home via private auto.  Madeline Cabrera C. Madeline Cabrera

## 2022-05-04 NOTE — Progress Notes (Signed)
[facility] SURGICAL PROGRESS NOTE (cpt [phone])  Hospital Day(s): 2.  Interval History: Patient seen and examined, no acute events or new complaints overnight. Patient reports she is feeling much better. She denies abdominal pain, nausea, emesis, distension. Labs are reassuring this AM. KUB without small bowel dilation. She is passing flatus and has two BM in the last 24 hours.   Review of Systems:  Constitutional: denies fever, chills  HEENT: denies cough or congestion  Respiratory: denies any shortness of breath  Cardiovascular: denies chest pain or palpitations  Gastrointestinal: denies abdominal pain, N/V Genitourinary: denies burning with urination or urinary frequency Musculoskeletal: denies pain, decreased motor or sensation  Vital signs in last 24 hours: [min-max] current  Temp:  [97.8 F (36.6 C)-98.3 F (36.8 C)] 97.8 F (36.6 C) (05/01 0809) Pulse Rate:  [64-74] 69 (05/01 0809) Resp:  [16-20] 19 (05/01 0809) BP: (118-184)/(54-118) 149/62 (05/01 0809) SpO2:  [98 %-99 %] 99 % (05/01 0809)     Height: 5\' 2"  (157.5 cm) Weight: 62.8 kg BMI (Calculated): 25.32   Intake/Output last 2 shifts:  04/30 0701 - 05/01 0700 In: 2029.3 [I.V.:2029.3] Out: -    Physical Exam:  Constitutional: alert, cooperative and no distress  HENT: normocephalic without obvious abnormality  Eyes: PERRL, EOM's grossly intact and symmetric  Respiratory: breathing non-labored at rest  Cardiovascular: regular rate and sinus rhythm  Gastrointestinal: soft, non-tender, and non-distended, no rebound/guarding. She is certainly without peritonitis  Musculoskeletal: no edema or wounds, motor and sensation grossly intact, NT    Labs:     Latest Ref Rng & Units 05/03/2022    4:54 AM 05/02/2022    8:05 PM 04/19/2018    4:24 PM  CBC  WBC 4.0 - 10.5 K/uL 11.5  12.8  8.8   Hemoglobin 12.0 - 15.0 g/dL 60.4  54.0  98.1   Hematocrit 36.0 - 46.0 % 36.9  39.8  38.3   Platelets 150 - 400 K/uL 222   261  247       Latest Ref Rng & Units 05/04/2022    6:18 AM 05/03/2022    4:54 AM 05/02/2022    8:05 PM  CMP  Glucose 70 - 99 mg/dL 83  191  98   BUN 8 - 23 mg/dL 17  26  33   Creatinine 0.44 - 1.00 mg/dL 4.78  2.95  6.21   Sodium 135 - 145 mmol/L 141  138  137   Potassium 3.5 - 5.1 mmol/L 4.0  4.2  3.9   Chloride 98 - 111 mmol/L 114  110  107   CO2 22 - 32 mmol/L 22  23  22    Calcium 8.9 - 10.3 mg/dL 9.0  8.6  9.7   Total Protein 6.5 - 8.1 g/dL   7.0   Total Bilirubin 0.3 - 1.2 mg/dL   0.8   Alkaline Phos 38 - 126 U/L   65   AST 15 - 41 U/L   26   ALT 0 - 44 U/L   18      Imaging studies:  KUB + CXR (05/04/2022) perosnally reviewed with resolution in small bowel gas pattern, no obstruction, no free air, and radiologist report reviewed: IMPRESSION: Moderate stool burden. No abnormal bowel dilatation. No acute cardiopulmonary disease.   Assessment/Plan: (ICD-10's: K30.609) 77 y.o. female with clinically and radiographically resolving partial SBO most likely secondary to post-surgical adhesions               -  Okay for CLD; advance to soft diet for lunch - No surgical intervention at this time - Monitor abdominal examination; ongoing bowel function - Pain control prn; antiemetics prn - Mobilize             - Further management per primary service   - Discharge Planning: Doing well, diet restarted and advance ad tolerated. She is anxious to go home this PM which is reasonable if tolerates PO   All of the above findings and recommendations were discussed with the patient, and all of patient's questions were answered to her expressed satisfaction.   -- Lynden Oxford, PA-C [facility] 05/04/2022, 8:11 AM M-F: 7am - 4pm

## 2022-05-09 DIAGNOSIS — I2510 Atherosclerotic heart disease of native coronary artery without angina pectoris: Secondary | ICD-10-CM | POA: Diagnosis not present

## 2022-05-11 DIAGNOSIS — M5416 Radiculopathy, lumbar region: Secondary | ICD-10-CM | POA: Diagnosis not present

## 2022-05-11 DIAGNOSIS — M48062 Spinal stenosis, lumbar region with neurogenic claudication: Secondary | ICD-10-CM | POA: Diagnosis not present

## 2022-05-11 DIAGNOSIS — M5126 Other intervertebral disc displacement, lumbar region: Secondary | ICD-10-CM | POA: Diagnosis not present

## 2022-05-17 DIAGNOSIS — M7662 Achilles tendinitis, left leg: Secondary | ICD-10-CM | POA: Diagnosis not present

## 2022-05-17 DIAGNOSIS — M722 Plantar fascial fibromatosis: Secondary | ICD-10-CM | POA: Diagnosis not present

## 2022-05-17 DIAGNOSIS — M79671 Pain in right foot: Secondary | ICD-10-CM | POA: Diagnosis not present

## 2022-05-17 DIAGNOSIS — M79672 Pain in left foot: Secondary | ICD-10-CM | POA: Diagnosis not present

## 2022-05-18 DIAGNOSIS — H353221 Exudative age-related macular degeneration, left eye, with active choroidal neovascularization: Secondary | ICD-10-CM | POA: Diagnosis not present

## 2022-05-18 DIAGNOSIS — H353231 Exudative age-related macular degeneration, bilateral, with active choroidal neovascularization: Secondary | ICD-10-CM | POA: Diagnosis not present

## 2022-05-18 DIAGNOSIS — Z961 Presence of intraocular lens: Secondary | ICD-10-CM | POA: Diagnosis not present

## 2022-05-18 DIAGNOSIS — H353211 Exudative age-related macular degeneration, right eye, with active choroidal neovascularization: Secondary | ICD-10-CM | POA: Diagnosis not present

## 2022-06-27 DIAGNOSIS — Z79899 Other long term (current) drug therapy: Secondary | ICD-10-CM | POA: Diagnosis not present

## 2022-06-27 DIAGNOSIS — M7541 Impingement syndrome of right shoulder: Secondary | ICD-10-CM | POA: Diagnosis not present

## 2022-06-27 DIAGNOSIS — M5416 Radiculopathy, lumbar region: Secondary | ICD-10-CM | POA: Diagnosis not present

## 2022-06-27 DIAGNOSIS — M48062 Spinal stenosis, lumbar region with neurogenic claudication: Secondary | ICD-10-CM | POA: Diagnosis not present

## 2022-06-27 DIAGNOSIS — M5126 Other intervertebral disc displacement, lumbar region: Secondary | ICD-10-CM | POA: Diagnosis not present

## 2022-08-02 DIAGNOSIS — R7303 Prediabetes: Secondary | ICD-10-CM | POA: Diagnosis not present

## 2022-08-02 DIAGNOSIS — I2510 Atherosclerotic heart disease of native coronary artery without angina pectoris: Secondary | ICD-10-CM | POA: Diagnosis not present

## 2022-08-02 DIAGNOSIS — I6523 Occlusion and stenosis of bilateral carotid arteries: Secondary | ICD-10-CM | POA: Diagnosis not present

## 2022-08-02 DIAGNOSIS — I10 Essential (primary) hypertension: Secondary | ICD-10-CM | POA: Diagnosis not present

## 2022-08-09 DIAGNOSIS — I6523 Occlusion and stenosis of bilateral carotid arteries: Secondary | ICD-10-CM | POA: Diagnosis not present

## 2022-08-09 DIAGNOSIS — I2510 Atherosclerotic heart disease of native coronary artery without angina pectoris: Secondary | ICD-10-CM | POA: Diagnosis not present

## 2022-08-09 DIAGNOSIS — F319 Bipolar disorder, unspecified: Secondary | ICD-10-CM | POA: Diagnosis not present

## 2022-08-09 DIAGNOSIS — F0390 Unspecified dementia without behavioral disturbance: Secondary | ICD-10-CM | POA: Diagnosis not present

## 2022-08-09 DIAGNOSIS — M791 Myalgia, unspecified site: Secondary | ICD-10-CM | POA: Diagnosis not present

## 2022-08-09 DIAGNOSIS — R7303 Prediabetes: Secondary | ICD-10-CM | POA: Diagnosis not present

## 2022-08-09 DIAGNOSIS — N1832 Chronic kidney disease, stage 3b: Secondary | ICD-10-CM | POA: Diagnosis not present

## 2022-08-09 DIAGNOSIS — T466X5A Adverse effect of antihyperlipidemic and antiarteriosclerotic drugs, initial encounter: Secondary | ICD-10-CM | POA: Diagnosis not present

## 2022-08-15 DIAGNOSIS — Z515 Encounter for palliative care: Secondary | ICD-10-CM | POA: Diagnosis not present

## 2022-08-15 DIAGNOSIS — F0393 Unspecified dementia, unspecified severity, with mood disturbance: Secondary | ICD-10-CM | POA: Diagnosis not present

## 2022-08-15 DIAGNOSIS — D692 Other nonthrombocytopenic purpura: Secondary | ICD-10-CM | POA: Diagnosis not present

## 2022-08-15 DIAGNOSIS — I110 Hypertensive heart disease with heart failure: Secondary | ICD-10-CM | POA: Diagnosis not present

## 2022-08-15 DIAGNOSIS — I5020 Unspecified systolic (congestive) heart failure: Secondary | ICD-10-CM | POA: Diagnosis not present

## 2022-08-15 DIAGNOSIS — H353290 Exudative age-related macular degeneration, unspecified eye, stage unspecified: Secondary | ICD-10-CM | POA: Diagnosis not present

## 2022-08-15 DIAGNOSIS — F330 Major depressive disorder, recurrent, mild: Secondary | ICD-10-CM | POA: Diagnosis not present

## 2022-08-15 DIAGNOSIS — Z7982 Long term (current) use of aspirin: Secondary | ICD-10-CM | POA: Diagnosis not present

## 2022-08-21 ENCOUNTER — Emergency Department
Admission: EM | Admit: 2022-08-21 | Discharge: 2022-08-21 | Disposition: A | Discharge status: Home / Self Care | Payer: PPO | Attending: Emergency Medicine | Admitting: Emergency Medicine

## 2022-08-21 ENCOUNTER — Other Ambulatory Visit: Payer: Self-pay

## 2022-08-21 ENCOUNTER — Emergency Department: Payer: PPO

## 2022-08-21 DIAGNOSIS — U071 COVID-19: Secondary | ICD-10-CM | POA: Diagnosis not present

## 2022-08-21 DIAGNOSIS — R519 Headache, unspecified: Secondary | ICD-10-CM | POA: Diagnosis present

## 2022-08-21 LAB — URINALYSIS, ROUTINE W REFLEX MICROSCOPIC
Bilirubin Urine: NEGATIVE
Glucose, UA: NEGATIVE mg/dL
Hgb urine dipstick: NEGATIVE
Ketones, ur: NEGATIVE mg/dL
Leukocytes,Ua: NEGATIVE
Nitrite: NEGATIVE
Protein, ur: NEGATIVE mg/dL
Specific Gravity, Urine: 1.012 (ref 1.005–1.030)
pH: 7 (ref 5.0–8.0)

## 2022-08-21 LAB — COMPREHENSIVE METABOLIC PANEL
ALT: 14 U/L (ref 0–44)
AST: 22 U/L (ref 15–41)
Albumin: 3.9 g/dL (ref 3.5–5.0)
Alkaline Phosphatase: 70 U/L (ref 38–126)
Anion gap: 6 (ref 5–15)
BUN: 23 mg/dL (ref 8–23)
CO2: 23 mmol/L (ref 22–32)
Calcium: 9.2 mg/dL (ref 8.9–10.3)
Chloride: 106 mmol/L (ref 98–111)
Creatinine, Ser: 1.11 mg/dL — ABNORMAL HIGH (ref 0.44–1.00)
GFR, Estimated: 51 mL/min — ABNORMAL LOW (ref >60)
Glucose, Bld: 82 mg/dL (ref 70–99)
Potassium: 4.2 mmol/L (ref 3.5–5.1)
Sodium: 135 mmol/L (ref 135–145)
Total Bilirubin: 0.9 mg/dL (ref 0.3–1.2)
Total Protein: 6.2 g/dL — ABNORMAL LOW (ref 6.5–8.1)

## 2022-08-21 LAB — CBC WITH DIFFERENTIAL/PLATELET
Abs Immature Granulocytes: 0.02 10*3/uL (ref 0.00–0.07)
Basophils Absolute: 0.1 10*3/uL (ref 0.0–0.1)
Basophils Relative: 1 %
Eosinophils Absolute: 0.2 10*3/uL (ref 0.0–0.5)
Eosinophils Relative: 3 %
HCT: 35 % — ABNORMAL LOW (ref 36.0–46.0)
Hemoglobin: 11.9 g/dL — ABNORMAL LOW (ref 12.0–15.0)
Immature Granulocytes: 0 %
Lymphocytes Relative: 12 %
Lymphs Abs: 1 10*3/uL (ref 0.7–4.0)
MCH: 32.3 pg (ref 26.0–34.0)
MCHC: 34 g/dL (ref 30.0–36.0)
MCV: 95.1 fL (ref 80.0–100.0)
Monocytes Absolute: 0.8 10*3/uL (ref 0.1–1.0)
Monocytes Relative: 10 %
Neutro Abs: 5.9 10*3/uL (ref 1.7–7.7)
Neutrophils Relative %: 74 %
Platelets: 221 10*3/uL (ref 150–400)
RBC: 3.68 MIL/uL — ABNORMAL LOW (ref 3.87–5.11)
RDW: 12.2 % (ref 11.5–15.5)
WBC: 8.1 10*3/uL (ref 4.0–10.5)
nRBC: 0 % (ref 0.0–0.2)

## 2022-08-21 LAB — SARS CORONAVIRUS 2 BY RT PCR: SARS Coronavirus 2 by RT PCR: POSITIVE — AB

## 2022-08-21 MED ORDER — ACETAMINOPHEN 500 MG PO TABS
1000.0000 mg | ORAL_TABLET | Freq: Once | ORAL | Status: AC
  Administered 2022-08-21: 1000 mg via ORAL
  Filled 2022-08-21: qty 2

## 2022-08-21 MED ORDER — IOHEXOL 300 MG/ML  SOLN: 100.0000 mL | Freq: Once | INTRAMUSCULAR | Status: DC | PRN

## 2022-08-21 MED ORDER — NIRMATRELVIR/RITONAVIR (PAXLOVID)TABLET: 3.0000 | ORAL_TABLET | Freq: Two times a day (BID) | ORAL | 0 refills | Status: AC

## 2022-08-21 NOTE — Discharge Instructions (Addendum)
You were found to be positive for COVID-19.  I have sent a medication to your pharmacy to pick up and take as prescribed.  You can also take Tylenol as well as maintaining hydration to help with symptoms.  Please return to emergency department for shortness of breath, significant abdominal pain, or vomiting.

## 2022-08-21 NOTE — ED Triage Notes (Signed)
Pt to ED for generalized body aches and HA since yesterday. Husband is covid positive. They ran out of home tests.

## 2022-08-21 NOTE — ED Provider Notes (Signed)
[facility] ([facility]) Provider Note    Event Date/Time   First MD Initiated Contact with Patient 08/21/22 1310     (approximate)   History   covid test   HPI Madeline Cabrera is a 77 y.o. female presenting today for headache and generalized bodyaches.  Husband is COVID-positive at home.  She also notes some lower abdominal pain that was new today.  Otherwise denies fever, chills, congestion, cough, shortness of breath, chest pain, nausea, vomiting.     Physical Exam   Triage Vital Signs: ED Triage Vitals  Encounter Vitals Group     BP 08/21/22 1251 129/78     Systolic BP Percentile --      Diastolic BP Percentile --      Pulse Rate 08/21/22 1251 61     Resp 08/21/22 1251 18     Temp 08/21/22 1251 98.9 F (37.2 C)     Temp Source 08/21/22 1251 Oral     SpO2 08/21/22 1251 100 %     Weight 08/21/22 1252 138 lb (62.6 kg)     Height 08/21/22 1252 5\' 2"  (1.575 m)     Head Circumference --      Peak Flow --      Pain Score 08/21/22 1249 10     Pain Loc --      Pain Education --      Exclude from Growth Chart --     Most recent vital signs: Vitals:   08/21/22 1251  BP: 129/78  Pulse: 61  Resp: 18  Temp: 98.9 F (37.2 C)  SpO2: 100%   Physical Exam: I have reviewed the vital signs and nursing notes. General: Awake, alert, no acute distress.  Nontoxic appearing. Head:  Atraumatic, normocephalic.   ENT:  EOM intact, PERRL. Oral mucosa is pink and moist with no lesions. Neck: Neck is supple with full range of motion, No meningeal signs. Cardiovascular:  RRR, No murmurs. Peripheral pulses palpable and equal bilaterally. Respiratory:  Symmetrical chest wall expansion.  No rhonchi, rales, or wheezes.  Good air movement throughout.  No use of accessory muscles.   Musculoskeletal:  No cyanosis or edema. Moving extremities with full ROM Abdomen:  Soft, tenderness to palpation in suprapubic area, nondistended. Neuro:  GCS 15, moving all four extremities,  interacting appropriately. Speech clear. Psych:  Calm, appropriate.   Skin:  Warm, dry, no rash.     ED Results / Procedures / Treatments   Labs (all labs ordered are listed, but only abnormal results are displayed) Labs Reviewed  SARS CORONAVIRUS 2 BY RT PCR - Abnormal; Notable for the following components:      Result Value   SARS Coronavirus 2 by RT PCR POSITIVE (*)    All other components within normal limits  CBC WITH DIFFERENTIAL/PLATELET - Abnormal; Notable for the following components:   RBC 3.68 (*)    Hemoglobin 11.9 (*)    HCT 35.0 (*)    All other components within normal limits  COMPREHENSIVE METABOLIC PANEL - Abnormal; Notable for the following components:   Creatinine, Ser 1.11 (*)    Total Protein 6.2 (*)    GFR, Estimated 51 (*)    All other components within normal limits  URINALYSIS, ROUTINE W REFLEX MICROSCOPIC - Abnormal; Notable for the following components:   Color, Urine YELLOW (*)    APPearance CLEAR (*)    All other components within normal limits     EKG    RADIOLOGY  PROCEDURES:  Critical Care performed: No  Procedures   MEDICATIONS ORDERED IN ED: Medications  iohexol (OMNIPAQUE) 300 MG/ML solution 100 mL (has no administration in time range)  acetaminophen (TYLENOL) tablet 1,000 mg (1,000 mg Oral Given 08/21/22 1343)     IMPRESSION / MDM / ASSESSMENT AND PLAN / ED COURSE  I reviewed the triage vital signs and the nursing notes.                              Differential diagnosis includes, but is not limited to, COVID-19, viral URI, UTI, diverticulitis, constipation.  Patient's presentation is most consistent with acute presentation with potential threat to life or bodily function.  Patient is a 77 year old female presenting today for body aches and headache with known exposure to COVID.  She did test positive for COVID-19 today.  Separately, on exam she had acute tenderness to palpation in her suprapubic region which did not  seem entirely consistent with her COVID diagnosis.  Further blood work was performed which was unremarkable and UA shows no evidence of a UTI.  Originally plan for CT abdomen/pelvis for further evaluation, but patient noted she had a bowel movement and pain symptoms completely resolved.  Physical exam was reassuring.  Patient did not want to stay for CT and was ready to be discharged at this time.  I do suspect respiratory symptoms are likely due to COVID-19 and she was discharged on Paxlovid after reviewing her home medications and discussing dose adjustments.  Patient was told to follow-up with PCP and return to the emergency department if she notices worsening shortness of breath, abdominal pain, or vomiting.  The patient is on the cardiac monitor to evaluate for evidence of arrhythmia and/or significant heart rate changes. Clinical Course as of 08/21/22 1611  Sun Aug 21, 2022  1329 SARS Coronavirus 2 by RT PCR(!): POSITIVE [DW]  1404 CBC with Differential(!) unremarkable [DW]  1426 Comprehensive metabolic panel(!) Creatinine comparable with baseline [DW]  1434 Urinalysis, Routine w reflex microscopic -Urine, Clean Catch(!) No UTI [DW]  1602 Patient went to the bathroom and noted abdominal pain had completely resolved.  She did not want to stay for a CT any longer.  Patient stated she was ready for discharge. [DW]  1607 Reviewed patient's home medication before prescribing Paxlovid.  I will talk with her about a dose reduction over the next several days while taking Paxlovid. [DW]    Clinical Course User Index [DW] Janith Lima, MD     FINAL CLINICAL IMPRESSION(S) / ED DIAGNOSES   Final diagnoses:  COVID-19     Rx / DC Orders   ED Discharge Orders          Ordered    nirmatrelvir/ritonavir (PAXLOVID) 20 x 150 MG & 10 x 100MG  TABS  2 times daily        08/21/22 1609             Note:  This document was prepared using Dragon voice recognition software and may include  unintentional dictation errors.   Janith Lima, MD 08/21/22 [phone]

## 2022-08-29 ENCOUNTER — Telehealth: Payer: Self-pay

## 2022-08-29 NOTE — Telephone Encounter (Signed)
Transition Care Management Follow-up Telephone Call Date of discharge and from where: [place] 8/18 How have you been since you were released from the hospital? Doing ok but weak and following up with PCP Any questions or concerns? No  Items Reviewed: Did the pt receive and understand the discharge instructions provided? Yes  Medications obtained and verified? No  Other? No  Any new allergies since your discharge? No  Dietary orders reviewed? No Do you have support at home? Yes     Follow up appointments reviewed:  PCP Hospital f/u appt confirmed? Yes  Scheduled to see  on  @ . Specialist Hospital f/u appt confirmed? No  Scheduled to see  on  @ . Are transportation arrangements needed? No  If their condition worsens, is the pt aware to call PCP or go to the Emergency Dept.? Yes Was the patient provided with contact information for the PCP's office or ED? Yes Was to pt encouraged to call back with questions or concerns? Yes

## 2022-08-30 DIAGNOSIS — M5136 Other intervertebral disc degeneration, lumbar region: Secondary | ICD-10-CM | POA: Diagnosis not present

## 2022-08-30 DIAGNOSIS — M7541 Impingement syndrome of right shoulder: Secondary | ICD-10-CM | POA: Diagnosis not present

## 2022-08-31 DIAGNOSIS — H353211 Exudative age-related macular degeneration, right eye, with active choroidal neovascularization: Secondary | ICD-10-CM | POA: Diagnosis not present

## 2022-08-31 DIAGNOSIS — H353221 Exudative age-related macular degeneration, left eye, with active choroidal neovascularization: Secondary | ICD-10-CM | POA: Diagnosis not present

## 2022-09-20 DIAGNOSIS — M5416 Radiculopathy, lumbar region: Secondary | ICD-10-CM | POA: Diagnosis not present

## 2022-09-20 DIAGNOSIS — M5126 Other intervertebral disc displacement, lumbar region: Secondary | ICD-10-CM | POA: Diagnosis not present

## 2022-10-13 DIAGNOSIS — M5416 Radiculopathy, lumbar region: Secondary | ICD-10-CM | POA: Diagnosis not present

## 2022-10-13 DIAGNOSIS — M7541 Impingement syndrome of right shoulder: Secondary | ICD-10-CM | POA: Diagnosis not present

## 2022-10-13 DIAGNOSIS — M7062 Trochanteric bursitis, left hip: Secondary | ICD-10-CM | POA: Diagnosis not present

## 2022-10-13 DIAGNOSIS — M48062 Spinal stenosis, lumbar region with neurogenic claudication: Secondary | ICD-10-CM | POA: Diagnosis not present

## 2022-10-13 DIAGNOSIS — Z79899 Other long term (current) drug therapy: Secondary | ICD-10-CM | POA: Diagnosis not present

## 2022-10-13 DIAGNOSIS — M7061 Trochanteric bursitis, right hip: Secondary | ICD-10-CM | POA: Diagnosis not present

## 2022-10-13 DIAGNOSIS — M25511 Pain in right shoulder: Secondary | ICD-10-CM | POA: Diagnosis not present

## 2022-10-13 DIAGNOSIS — M5126 Other intervertebral disc displacement, lumbar region: Secondary | ICD-10-CM | POA: Diagnosis not present

## 2022-10-14 ENCOUNTER — Other Ambulatory Visit: Payer: Self-pay | Admitting: Physical Medicine and Rehabilitation

## 2022-10-14 DIAGNOSIS — M5416 Radiculopathy, lumbar region: Secondary | ICD-10-CM

## 2022-10-19 DIAGNOSIS — M19111 Post-traumatic osteoarthritis, right shoulder: Secondary | ICD-10-CM | POA: Diagnosis not present

## 2022-10-19 DIAGNOSIS — M7541 Impingement syndrome of right shoulder: Secondary | ICD-10-CM | POA: Diagnosis not present

## 2022-10-19 DIAGNOSIS — G8929 Other chronic pain: Secondary | ICD-10-CM | POA: Diagnosis not present

## 2022-10-26 DIAGNOSIS — I2510 Atherosclerotic heart disease of native coronary artery without angina pectoris: Secondary | ICD-10-CM | POA: Diagnosis not present

## 2022-10-26 DIAGNOSIS — R001 Bradycardia, unspecified: Secondary | ICD-10-CM | POA: Diagnosis not present

## 2022-10-26 DIAGNOSIS — I38 Endocarditis, valve unspecified: Secondary | ICD-10-CM | POA: Diagnosis not present

## 2022-10-26 DIAGNOSIS — I447 Left bundle-branch block, unspecified: Secondary | ICD-10-CM | POA: Diagnosis not present

## 2022-10-26 DIAGNOSIS — I6523 Occlusion and stenosis of bilateral carotid arteries: Secondary | ICD-10-CM | POA: Diagnosis not present

## 2022-10-26 DIAGNOSIS — I10 Essential (primary) hypertension: Secondary | ICD-10-CM | POA: Diagnosis not present

## 2022-10-28 DIAGNOSIS — K5909 Other constipation: Secondary | ICD-10-CM | POA: Diagnosis not present

## 2022-10-28 DIAGNOSIS — K222 Esophageal obstruction: Secondary | ICD-10-CM | POA: Diagnosis not present

## 2022-10-28 DIAGNOSIS — K219 Gastro-esophageal reflux disease without esophagitis: Secondary | ICD-10-CM | POA: Diagnosis not present

## 2022-11-02 DIAGNOSIS — H35433 Paving stone degeneration of retina, bilateral: Secondary | ICD-10-CM | POA: Diagnosis not present

## 2022-11-07 ENCOUNTER — Inpatient Hospital Stay
Admission: RE | Admit: 2022-11-07 | Discharge: 2022-11-07 | Discharge status: Home / Self Care | Payer: PPO | Source: Ambulatory Visit | Attending: Physical Medicine and Rehabilitation | Admitting: Physical Medicine and Rehabilitation

## 2022-11-07 DIAGNOSIS — M5416 Radiculopathy, lumbar region: Secondary | ICD-10-CM

## 2022-11-07 DIAGNOSIS — M47816 Spondylosis without myelopathy or radiculopathy, lumbar region: Secondary | ICD-10-CM | POA: Diagnosis not present

## 2022-11-07 DIAGNOSIS — M5126 Other intervertebral disc displacement, lumbar region: Secondary | ICD-10-CM | POA: Diagnosis not present

## 2022-11-08 DIAGNOSIS — F0393 Unspecified dementia, unspecified severity, with mood disturbance: Secondary | ICD-10-CM | POA: Diagnosis not present

## 2022-11-08 DIAGNOSIS — Z7901 Long term (current) use of anticoagulants: Secondary | ICD-10-CM | POA: Diagnosis not present

## 2022-11-08 DIAGNOSIS — Z6824 Body mass index (BMI) 24.0-24.9, adult: Secondary | ICD-10-CM | POA: Diagnosis not present

## 2022-11-08 DIAGNOSIS — I5020 Unspecified systolic (congestive) heart failure: Secondary | ICD-10-CM | POA: Diagnosis not present

## 2022-11-08 DIAGNOSIS — Z515 Encounter for palliative care: Secondary | ICD-10-CM | POA: Diagnosis not present

## 2022-11-08 DIAGNOSIS — D692 Other nonthrombocytopenic purpura: Secondary | ICD-10-CM | POA: Diagnosis not present

## 2022-11-08 DIAGNOSIS — I110 Hypertensive heart disease with heart failure: Secondary | ICD-10-CM | POA: Diagnosis not present

## 2022-11-17 DIAGNOSIS — M5416 Radiculopathy, lumbar region: Secondary | ICD-10-CM | POA: Diagnosis not present

## 2022-11-17 DIAGNOSIS — M48062 Spinal stenosis, lumbar region with neurogenic claudication: Secondary | ICD-10-CM | POA: Diagnosis not present

## 2022-11-17 DIAGNOSIS — M7541 Impingement syndrome of right shoulder: Secondary | ICD-10-CM | POA: Diagnosis not present

## 2022-11-17 DIAGNOSIS — M7061 Trochanteric bursitis, right hip: Secondary | ICD-10-CM | POA: Diagnosis not present

## 2022-11-17 DIAGNOSIS — M25511 Pain in right shoulder: Secondary | ICD-10-CM | POA: Diagnosis not present

## 2022-11-17 DIAGNOSIS — Z79899 Other long term (current) drug therapy: Secondary | ICD-10-CM | POA: Diagnosis not present

## 2022-11-17 DIAGNOSIS — M7062 Trochanteric bursitis, left hip: Secondary | ICD-10-CM | POA: Diagnosis not present

## 2022-11-17 DIAGNOSIS — M5126 Other intervertebral disc displacement, lumbar region: Secondary | ICD-10-CM | POA: Diagnosis not present

## 2022-11-24 DIAGNOSIS — H353231 Exudative age-related macular degeneration, bilateral, with active choroidal neovascularization: Secondary | ICD-10-CM | POA: Diagnosis not present

## 2022-11-24 DIAGNOSIS — H43813 Vitreous degeneration, bilateral: Secondary | ICD-10-CM | POA: Diagnosis not present

## 2022-11-24 DIAGNOSIS — H43393 Other vitreous opacities, bilateral: Secondary | ICD-10-CM | POA: Diagnosis not present

## 2022-12-19 DIAGNOSIS — M7061 Trochanteric bursitis, right hip: Secondary | ICD-10-CM | POA: Diagnosis not present

## 2022-12-19 DIAGNOSIS — M7062 Trochanteric bursitis, left hip: Secondary | ICD-10-CM | POA: Diagnosis not present

## 2022-12-19 DIAGNOSIS — M5416 Radiculopathy, lumbar region: Secondary | ICD-10-CM | POA: Diagnosis not present

## 2022-12-19 DIAGNOSIS — H6692 Otitis media, unspecified, left ear: Secondary | ICD-10-CM | POA: Diagnosis not present

## 2022-12-19 DIAGNOSIS — Z79899 Other long term (current) drug therapy: Secondary | ICD-10-CM | POA: Diagnosis not present

## 2022-12-19 DIAGNOSIS — M48062 Spinal stenosis, lumbar region with neurogenic claudication: Secondary | ICD-10-CM | POA: Diagnosis not present

## 2022-12-19 DIAGNOSIS — M5126 Other intervertebral disc displacement, lumbar region: Secondary | ICD-10-CM | POA: Diagnosis not present

## 2022-12-19 DIAGNOSIS — M7541 Impingement syndrome of right shoulder: Secondary | ICD-10-CM | POA: Diagnosis not present

## 2022-12-19 DIAGNOSIS — R590 Localized enlarged lymph nodes: Secondary | ICD-10-CM | POA: Diagnosis not present

## 2022-12-19 DIAGNOSIS — M25511 Pain in right shoulder: Secondary | ICD-10-CM | POA: Diagnosis not present

## 2022-12-20 DIAGNOSIS — I10 Essential (primary) hypertension: Secondary | ICD-10-CM | POA: Diagnosis not present

## 2023-01-05 ENCOUNTER — Emergency Department
Admission: EM | Admit: 2023-01-05 | Discharge: 2023-01-06 | Disposition: A | Discharge status: Home / Self Care | Payer: PPO | Attending: Emergency Medicine | Admitting: Emergency Medicine

## 2023-01-05 ENCOUNTER — Encounter: Payer: Self-pay | Admitting: Emergency Medicine

## 2023-01-05 ENCOUNTER — Other Ambulatory Visit: Payer: Self-pay

## 2023-01-05 DIAGNOSIS — N200 Calculus of kidney: Secondary | ICD-10-CM | POA: Diagnosis not present

## 2023-01-05 DIAGNOSIS — I2510 Atherosclerotic heart disease of native coronary artery without angina pectoris: Secondary | ICD-10-CM | POA: Diagnosis not present

## 2023-01-05 DIAGNOSIS — I10 Essential (primary) hypertension: Secondary | ICD-10-CM | POA: Insufficient documentation

## 2023-01-05 DIAGNOSIS — Z981 Arthrodesis status: Secondary | ICD-10-CM | POA: Diagnosis not present

## 2023-01-05 DIAGNOSIS — M5136 Other intervertebral disc degeneration, lumbar region with discogenic back pain only: Secondary | ICD-10-CM | POA: Diagnosis not present

## 2023-01-05 DIAGNOSIS — M545 Low back pain, unspecified: Secondary | ICD-10-CM | POA: Diagnosis present

## 2023-01-05 DIAGNOSIS — M5431 Sciatica, right side: Secondary | ICD-10-CM | POA: Diagnosis not present

## 2023-01-05 DIAGNOSIS — X58XXXA Exposure to other specified factors, initial encounter: Secondary | ICD-10-CM | POA: Diagnosis not present

## 2023-01-05 DIAGNOSIS — S39012A Strain of muscle, fascia and tendon of lower back, initial encounter: Secondary | ICD-10-CM | POA: Diagnosis not present

## 2023-01-05 DIAGNOSIS — K449 Diaphragmatic hernia without obstruction or gangrene: Secondary | ICD-10-CM | POA: Diagnosis not present

## 2023-01-05 DIAGNOSIS — M5441 Lumbago with sciatica, right side: Secondary | ICD-10-CM | POA: Diagnosis not present

## 2023-01-05 DIAGNOSIS — K8689 Other specified diseases of pancreas: Secondary | ICD-10-CM | POA: Diagnosis not present

## 2023-01-05 DIAGNOSIS — M48061 Spinal stenosis, lumbar region without neurogenic claudication: Secondary | ICD-10-CM | POA: Diagnosis not present

## 2023-01-05 DIAGNOSIS — K5730 Diverticulosis of large intestine without perforation or abscess without bleeding: Secondary | ICD-10-CM | POA: Diagnosis not present

## 2023-01-05 DIAGNOSIS — M47816 Spondylosis without myelopathy or radiculopathy, lumbar region: Secondary | ICD-10-CM | POA: Diagnosis not present

## 2023-01-05 DIAGNOSIS — H353231 Exudative age-related macular degeneration, bilateral, with active choroidal neovascularization: Secondary | ICD-10-CM | POA: Diagnosis not present

## 2023-01-05 LAB — URINALYSIS, ROUTINE W REFLEX MICROSCOPIC
Bilirubin Urine: NEGATIVE
Glucose, UA: NEGATIVE mg/dL
Hgb urine dipstick: NEGATIVE
Ketones, ur: NEGATIVE mg/dL
Leukocytes,Ua: NEGATIVE
Nitrite: NEGATIVE
Protein, ur: NEGATIVE mg/dL
Specific Gravity, Urine: 1.006 (ref 1.005–1.030)
pH: 7 (ref 5.0–8.0)

## 2023-01-05 MED ORDER — MORPHINE SULFATE (PF) 4 MG/ML IV SOLN
4.0000 mg | Freq: Once | INTRAVENOUS | Status: AC
  Administered 2023-01-05: 4 mg via INTRAVENOUS
  Filled 2023-01-05: qty 1

## 2023-01-05 MED ORDER — ONDANSETRON HCL 4 MG/2ML IJ SOLN
4.0000 mg | Freq: Once | INTRAMUSCULAR | Status: AC
  Administered 2023-01-05: 4 mg via INTRAVENOUS
  Filled 2023-01-05: qty 2

## 2023-01-05 MED ORDER — OXYCODONE-ACETAMINOPHEN 5-325 MG PO TABS
1.0000 | ORAL_TABLET | Freq: Once | ORAL | Status: AC
  Administered 2023-01-05: 1 via ORAL
  Filled 2023-01-05: qty 1

## 2023-01-05 MED ORDER — MORPHINE SULFATE (PF) 4 MG/ML IV SOLN
6.0000 mg | Freq: Once | INTRAVENOUS | Status: AC
  Administered 2023-01-05: 6 mg via INTRAVENOUS
  Filled 2023-01-05: qty 2

## 2023-01-05 NOTE — ED Triage Notes (Signed)
 Patient to ED via POV for lower back pain that radiates into right leg. States it started this AM when getting up from the bathroom.

## 2023-01-05 NOTE — ED Provider Notes (Signed)
 [facility] Provider Note    Event Date/Time   First MD Initiated Contact with Patient 01/05/23 2019     (approximate)   History   Back Pain   HPI  Madeline Cabrera is a 78 y.o. female past medical history significant for hypertension, hyperlipidemia, CAD, history of bowel obstruction, who presents to the emergency department with severe pain.  States that this morning she had a sudden onset of severe pain in her back that radiated to her right upper back and right upper leg.  Severe pain and states she is unable to get comfortable.  Feels nauseous and like she might throw up secondary to the pain.  Denies any falls or trauma.  Denies any dysuria, urinary urgency or frequency.  History of a bowel obstruction.  Denies any history of kidney stone.  Does not take anything for the pain.  Denies any lower extremity numbness or weakness.  No urinary or bowel incontinence.     Physical Exam   Triage Vital Signs: ED Triage Vitals  Encounter Vitals Group     BP 01/05/23 1752 (!) 120/105     Systolic BP Percentile --      Diastolic BP Percentile --      Pulse Rate 01/05/23 1752 65     Resp 01/05/23 1752 18     Temp --      Temp src --      SpO2 01/05/23 1752 100 %     Weight 01/05/23 1750 136 lb 11 oz (62 kg)     Height 01/05/23 1750 5' 2 (1.575 m)     Head Circumference --      Peak Flow --      Pain Score 01/05/23 1750 10     Pain Loc --      Pain Education --      Exclude from Growth Chart --     Most recent vital signs: Vitals:   01/05/23 1752  BP: (!) 120/105  Pulse: 65  Resp: 18  SpO2: 100%    Physical Exam Constitutional:      General: She is in acute distress.     Appearance: She is well-developed.  HENT:     Head: Atraumatic.  Eyes:     Conjunctiva/sclera: Conjunctivae normal.  Cardiovascular:     Rate and Rhythm: Regular rhythm.  Pulmonary:     Effort: No respiratory distress.  Abdominal:     General: There is no distension.      Tenderness: There is abdominal tenderness (Right CVA tenderness).  Musculoskeletal:        General: Normal range of motion.     Cervical back: Normal range of motion.     Comments: Mild tenderness to palpation to the lower lumbar spine.  Significant pain with tenderness to the right buttocks and hip.  +2 DP pulses that are equal bilaterally.  Sensation intact to lower extremities.  5/5 strength bilateral lower extremities and no saddle anesthesia.  Skin:    General: Skin is warm.  Neurological:     Mental Status: She is alert. Mental status is at baseline.     IMPRESSION / MDM / ASSESSMENT AND PLAN / ED COURSE  I reviewed the triage vital signs and the nursing notes.  Differential diagnosis including sciatica, radiculopathy, piriformis syndrome, kidney stone, musculoskeletal, pathologic fracture.  Clinical picture is not consistent with cauda equina or epidural compression syndrome and do not feel that emergent MRI is necessary at this  time.  Intact pulses, doubt dissection.     RADIOLOGY I independently reviewed imaging, my interpretation of imaging: CT scan abdomen and pelvis with contrast with lumbar reformats  LABS (all labs ordered are listed, but only abnormal results are displayed) Labs interpreted as -    Labs Reviewed  URINALYSIS, ROUTINE W REFLEX MICROSCOPIC - Abnormal; Notable for the following components:      Result Value   Color, Urine YELLOW (*)    APPearance CLEAR (*)    All other components within normal limits  CBC  COMPREHENSIVE METABOLIC PANEL     MDM    Patient given multiple doses of IV pain medication.  UA with no signs of urinary tract infection.  No blood in the urine.  On reevaluation minimal improvement with IV morphine .  Given a second dose of IV morphine , IV infiltrated.  Started another line, delaying blood work and CT imaging.  Care transferred to incoming provider, plan for pain control and follow-up on CT imaging.  If reassuring less  likely has radiculopathy/sciatica and will need pain control and PCP follow up.     PROCEDURES:  Critical Care performed: No  Procedures  Patient's presentation is most consistent with acute presentation with potential threat to life or bodily function.   MEDICATIONS ORDERED IN ED: Medications  oxyCODONE -acetaminophen  (PERCOCET/ROXICET) 5-325 MG per tablet 1 tablet (1 tablet Oral Given 01/05/23 2143)  morphine  (PF) 4 MG/ML injection 4 mg (4 mg Intravenous Given 01/05/23 2244)  ondansetron  (ZOFRAN ) injection 4 mg (4 mg Intravenous Given 01/05/23 2243)  morphine  (PF) 4 MG/ML injection 6 mg (6 mg Intravenous Given 01/05/23 2342)    FINAL CLINICAL IMPRESSION(S) / ED DIAGNOSES   Final diagnoses:  None     Rx / DC Orders   ED Discharge Orders     None        Note:  This document was prepared using Dragon voice recognition software and may include unintentional dictation errors.   Suzanne Kirsch, MD 01/06/23 [phone]

## 2023-01-05 NOTE — ED Provider Triage Note (Signed)
 Emergency Medicine Provider Triage Evaluation Note  Madeline Cabrera , a 78 y.o. female  was evaluated in triage.  Pt complains of lower back pain that radiates down her right leg. Began this morning after getting off the toilet.   Review of Systems  Positive: Lower back pain Negative: Saddle anesthesia, numbness/tingling in the right leg, changes in bladder/bowel function  Physical Exam  Ht 5' 2 (1.575 m)   Wt 62 kg   BMI 25.00 kg/m  Gen:   Awake, no distress   Resp:  Normal effort  MSK:   Moves extremities without difficulty  Other:    Medical Decision Making  Medically screening exam initiated at 5:52 PM.  Appropriate orders placed.  Madeline Cabrera was informed that the remainder of the evaluation will be completed by another provider, this initial triage assessment does not replace that evaluation, and the importance of remaining in the ED until their evaluation is complete.    Madeline Tinnie DELENA, PA-C 01/05/23 1754

## 2023-01-05 NOTE — ED Provider Notes (Signed)
 11:46 PM  Assumed care at shift change.  UA unremarkable.  Labs, CTs pending.  Patient may need admission for pain control.  1:47 AM  Pt's labs show chronic kidney disease which is stable.  Bicarb of 16 likely secondary to uremia but normal anion gap, glucose.  Urine shows no ketonuria.  No blood in her urine or sign of infection.  CTs reviewed and interpreted by myself and the radiologist.  She does have right nephrolithiasis but no ureterolithiasis or hydronephrosis.  She has diverticulosis without diverticulitis.  Will attempt to ambulate patient here in the ED.  Suspect lumbar radiculopathy causing symptoms today.  2:32 AM  Pt able to ambulate with her cane.  Have offered admission for pain control, physical therapy but patient prefer discharge home.  Will discharge with pain medication.  Recommend close follow-up with her PCP.   At this time, I do not feel there is any life-threatening condition present. I reviewed all nursing notes, vitals, pertinent previous records.  All lab and urine results, EKGs, imaging ordered have been independently reviewed and interpreted by myself.  I reviewed all available radiology reports from any imaging ordered this visit.  Based on my assessment, I feel the patient is safe to be discharged home without further emergent workup and can continue workup as an outpatient as needed. Discussed all findings, treatment plan as well as usual and customary return precautions.  They verbalize understanding and are comfortable with this plan.  Outpatient follow-up has been provided as needed.  All questions have been answered.    Debria Broecker, Josette SAILOR, DO 01/06/23 [phone]

## 2023-01-06 ENCOUNTER — Emergency Department: Payer: PPO

## 2023-01-06 DIAGNOSIS — M47816 Spondylosis without myelopathy or radiculopathy, lumbar region: Secondary | ICD-10-CM | POA: Diagnosis not present

## 2023-01-06 DIAGNOSIS — M48061 Spinal stenosis, lumbar region without neurogenic claudication: Secondary | ICD-10-CM | POA: Diagnosis not present

## 2023-01-06 DIAGNOSIS — K449 Diaphragmatic hernia without obstruction or gangrene: Secondary | ICD-10-CM | POA: Diagnosis not present

## 2023-01-06 DIAGNOSIS — N200 Calculus of kidney: Secondary | ICD-10-CM | POA: Diagnosis not present

## 2023-01-06 DIAGNOSIS — Z981 Arthrodesis status: Secondary | ICD-10-CM | POA: Diagnosis not present

## 2023-01-06 DIAGNOSIS — M5136 Other intervertebral disc degeneration, lumbar region with discogenic back pain only: Secondary | ICD-10-CM | POA: Diagnosis not present

## 2023-01-06 DIAGNOSIS — K8689 Other specified diseases of pancreas: Secondary | ICD-10-CM | POA: Diagnosis not present

## 2023-01-06 DIAGNOSIS — K5730 Diverticulosis of large intestine without perforation or abscess without bleeding: Secondary | ICD-10-CM | POA: Diagnosis not present

## 2023-01-06 LAB — COMPREHENSIVE METABOLIC PANEL
ALT: 18 U/L (ref 0–44)
AST: 27 U/L (ref 15–41)
Albumin: 3.8 g/dL (ref 3.5–5.0)
Alkaline Phosphatase: 62 U/L (ref 38–126)
Anion gap: 14 (ref 5–15)
BUN: 27 mg/dL — ABNORMAL HIGH (ref 8–23)
CO2: 16 mmol/L — ABNORMAL LOW (ref 22–32)
Calcium: 9.6 mg/dL (ref 8.9–10.3)
Chloride: 107 mmol/L (ref 98–111)
Creatinine, Ser: 1.28 mg/dL — ABNORMAL HIGH (ref 0.44–1.00)
GFR, Estimated: 43 mL/min — ABNORMAL LOW (ref >60)
Glucose, Bld: 88 mg/dL (ref 70–99)
Potassium: 3.9 mmol/L (ref 3.5–5.1)
Sodium: 137 mmol/L (ref 135–145)
Total Bilirubin: 1.1 mg/dL (ref 0.0–1.2)
Total Protein: 6.5 g/dL (ref 6.5–8.1)

## 2023-01-06 LAB — CBC
HCT: 36.1 % (ref 36.0–46.0)
Hemoglobin: 12.6 g/dL (ref 12.0–15.0)
MCH: 31.6 pg (ref 26.0–34.0)
MCHC: 34.9 g/dL (ref 30.0–36.0)
MCV: 90.5 fL (ref 80.0–100.0)
Platelets: 214 10*3/uL (ref 150–400)
RBC: 3.99 MIL/uL (ref 3.87–5.11)
RDW: 12.5 % (ref 11.5–15.5)
WBC: 10.4 10*3/uL (ref 4.0–10.5)
nRBC: 0 % (ref 0.0–0.2)

## 2023-01-06 MED ORDER — IOHEXOL 300 MG/ML  SOLN
80.0000 mL | Freq: Once | INTRAMUSCULAR | Status: AC | PRN
  Administered 2023-01-06: 80 mL via INTRAVENOUS

## 2023-01-06 MED ORDER — ACETAMINOPHEN 500 MG PO TABS: 1000.0000 mg | ORAL_TABLET | Freq: Four times a day (QID) | ORAL | 0 refills | Status: AC | PRN

## 2023-01-06 MED ORDER — ONDANSETRON 4 MG PO TBDP: 4.0000 mg | ORAL_TABLET | Freq: Four times a day (QID) | ORAL | 0 refills | Status: AC | PRN

## 2023-01-06 MED ORDER — OXYCODONE HCL 5 MG PO TABS: 5.0000 mg | ORAL_TABLET | Freq: Four times a day (QID) | ORAL | 0 refills | Status: DC | PRN

## 2023-01-06 MED ORDER — HYDROMORPHONE HCL 1 MG/ML IJ SOLN
0.5000 mg | Freq: Once | INTRAMUSCULAR | Status: AC
  Administered 2023-01-06: 0.5 mg via INTRAVENOUS
  Filled 2023-01-06: qty 0.5

## 2023-01-06 MED ORDER — LIDOCAINE 5 % EX PTCH
1.0000 | MEDICATED_PATCH | Freq: Once | CUTANEOUS | Status: DC
  Administered 2023-01-06: 1 via TRANSDERMAL
  Filled 2023-01-06: qty 1

## 2023-01-06 MED ORDER — DEXAMETHASONE SODIUM PHOSPHATE 10 MG/ML IJ SOLN
10.0000 mg | Freq: Once | INTRAMUSCULAR | Status: AC
  Administered 2023-01-06: 10 mg via INTRAVENOUS
  Filled 2023-01-06: qty 1

## 2023-01-06 MED ORDER — DOCUSATE SODIUM 100 MG PO CAPS: 100.0000 mg | ORAL_CAPSULE | Freq: Two times a day (BID) | ORAL | 0 refills | Status: AC

## 2023-01-06 MED ORDER — LIDOCAINE 5 % EX PTCH: 1.0000 | MEDICATED_PATCH | Freq: Two times a day (BID) | CUTANEOUS | 0 refills | Status: AC

## 2023-01-06 NOTE — ED Notes (Signed)
 Pt ambulated in hallway with cane and stand by assist.

## 2023-01-06 NOTE — Discharge Instructions (Addendum)
 You are being provided a prescription for opiates (also known as narcotics) for pain control.  Opiates can be addictive and should only be used when absolutely necessary for pain control when other alternatives do not work.  We recommend you only use them for the recommended amount of time and only as prescribed.  Please do not take with other sedative medications or alcohol.  Please do not drive, operate machinery, make important decisions while taking opiates.  Please note that these medications can be addictive and have high abuse potential.  Patients can become addicted to narcotics after only taking them for a few days.  Please keep these medications locked away from children, teenagers or any family members with history of substance abuse.  Narcotic pain medicine may also make you constipated.  You may use over-the-counter medications such as MiraLAX , Colace to prevent constipation.  If you become constipated, you may use over-the-counter enemas as needed.  Itching and nausea are also common side effects of narcotic pain medication.  If you develop uncontrolled vomiting or a rash, please stop these medications and seek medical care.  Your lab work, urine, CT scans were reassuring today.  We suspect that you have lumbar radiculopathy causing your symptoms.  If your pain is not improving, I recommend close follow-up with your PCP.  If you develop numbness or weakness in both of your legs, inability to hold your bowel or bladder, inability to walk, please return to the emergency department.

## 2023-01-08 DIAGNOSIS — S82844A Nondisplaced bimalleolar fracture of right lower leg, initial encounter for closed fracture: Secondary | ICD-10-CM | POA: Diagnosis not present

## 2023-01-10 DIAGNOSIS — S82851A Displaced trimalleolar fracture of right lower leg, initial encounter for closed fracture: Secondary | ICD-10-CM | POA: Diagnosis not present

## 2023-01-17 DIAGNOSIS — S82851A Displaced trimalleolar fracture of right lower leg, initial encounter for closed fracture: Secondary | ICD-10-CM | POA: Diagnosis not present

## 2023-01-24 DIAGNOSIS — M5416 Radiculopathy, lumbar region: Secondary | ICD-10-CM | POA: Diagnosis not present

## 2023-01-24 DIAGNOSIS — S82851A Displaced trimalleolar fracture of right lower leg, initial encounter for closed fracture: Secondary | ICD-10-CM | POA: Diagnosis not present

## 2023-02-06 DIAGNOSIS — M7062 Trochanteric bursitis, left hip: Secondary | ICD-10-CM | POA: Diagnosis not present

## 2023-02-06 DIAGNOSIS — M48062 Spinal stenosis, lumbar region with neurogenic claudication: Secondary | ICD-10-CM | POA: Diagnosis not present

## 2023-02-06 DIAGNOSIS — Z79899 Other long term (current) drug therapy: Secondary | ICD-10-CM | POA: Diagnosis not present

## 2023-02-06 DIAGNOSIS — M7541 Impingement syndrome of right shoulder: Secondary | ICD-10-CM | POA: Diagnosis not present

## 2023-02-06 DIAGNOSIS — M5126 Other intervertebral disc displacement, lumbar region: Secondary | ICD-10-CM | POA: Diagnosis not present

## 2023-02-06 DIAGNOSIS — M7061 Trochanteric bursitis, right hip: Secondary | ICD-10-CM | POA: Diagnosis not present

## 2023-02-06 DIAGNOSIS — M5416 Radiculopathy, lumbar region: Secondary | ICD-10-CM | POA: Diagnosis not present

## 2023-02-14 DIAGNOSIS — S82851A Displaced trimalleolar fracture of right lower leg, initial encounter for closed fracture: Secondary | ICD-10-CM | POA: Diagnosis not present

## 2023-02-20 DIAGNOSIS — M7918 Myalgia, other site: Secondary | ICD-10-CM | POA: Diagnosis not present

## 2023-03-02 DIAGNOSIS — R7303 Prediabetes: Secondary | ICD-10-CM | POA: Diagnosis not present

## 2023-03-02 DIAGNOSIS — N1832 Chronic kidney disease, stage 3b: Secondary | ICD-10-CM | POA: Diagnosis not present

## 2023-03-02 DIAGNOSIS — I2510 Atherosclerotic heart disease of native coronary artery without angina pectoris: Secondary | ICD-10-CM | POA: Diagnosis not present

## 2023-03-02 DIAGNOSIS — I6523 Occlusion and stenosis of bilateral carotid arteries: Secondary | ICD-10-CM | POA: Diagnosis not present

## 2023-03-03 DIAGNOSIS — M48062 Spinal stenosis, lumbar region with neurogenic claudication: Secondary | ICD-10-CM | POA: Diagnosis not present

## 2023-03-03 DIAGNOSIS — M5416 Radiculopathy, lumbar region: Secondary | ICD-10-CM | POA: Diagnosis not present

## 2023-03-09 DIAGNOSIS — I2510 Atherosclerotic heart disease of native coronary artery without angina pectoris: Secondary | ICD-10-CM | POA: Diagnosis not present

## 2023-03-09 DIAGNOSIS — F0390 Unspecified dementia without behavioral disturbance: Secondary | ICD-10-CM | POA: Diagnosis not present

## 2023-03-09 DIAGNOSIS — R7303 Prediabetes: Secondary | ICD-10-CM | POA: Diagnosis not present

## 2023-03-09 DIAGNOSIS — M5126 Other intervertebral disc displacement, lumbar region: Secondary | ICD-10-CM | POA: Diagnosis not present

## 2023-03-09 DIAGNOSIS — M1611 Unilateral primary osteoarthritis, right hip: Secondary | ICD-10-CM | POA: Diagnosis not present

## 2023-03-09 DIAGNOSIS — G720 Drug-induced myopathy: Secondary | ICD-10-CM | POA: Diagnosis not present

## 2023-03-09 DIAGNOSIS — M5416 Radiculopathy, lumbar region: Secondary | ICD-10-CM | POA: Diagnosis not present

## 2023-03-09 DIAGNOSIS — T466X5A Adverse effect of antihyperlipidemic and antiarteriosclerotic drugs, initial encounter: Secondary | ICD-10-CM | POA: Diagnosis not present

## 2023-03-09 DIAGNOSIS — M48062 Spinal stenosis, lumbar region with neurogenic claudication: Secondary | ICD-10-CM | POA: Diagnosis not present

## 2023-03-09 DIAGNOSIS — I6523 Occlusion and stenosis of bilateral carotid arteries: Secondary | ICD-10-CM | POA: Diagnosis not present

## 2023-03-09 DIAGNOSIS — F319 Bipolar disorder, unspecified: Secondary | ICD-10-CM | POA: Diagnosis not present

## 2023-03-09 DIAGNOSIS — N1832 Chronic kidney disease, stage 3b: Secondary | ICD-10-CM | POA: Diagnosis not present

## 2023-03-09 DIAGNOSIS — Z0000 Encounter for general adult medical examination without abnormal findings: Secondary | ICD-10-CM | POA: Diagnosis not present

## 2023-03-09 DIAGNOSIS — M1711 Unilateral primary osteoarthritis, right knee: Secondary | ICD-10-CM | POA: Diagnosis not present

## 2023-03-13 DIAGNOSIS — M25561 Pain in right knee: Secondary | ICD-10-CM | POA: Diagnosis not present

## 2023-03-13 DIAGNOSIS — G8929 Other chronic pain: Secondary | ICD-10-CM | POA: Diagnosis not present

## 2023-03-13 DIAGNOSIS — M1611 Unilateral primary osteoarthritis, right hip: Secondary | ICD-10-CM | POA: Diagnosis not present

## 2023-03-14 DIAGNOSIS — I6523 Occlusion and stenosis of bilateral carotid arteries: Secondary | ICD-10-CM | POA: Diagnosis not present

## 2023-03-14 DIAGNOSIS — I447 Left bundle-branch block, unspecified: Secondary | ICD-10-CM | POA: Diagnosis not present

## 2023-03-14 DIAGNOSIS — I2510 Atherosclerotic heart disease of native coronary artery without angina pectoris: Secondary | ICD-10-CM | POA: Diagnosis not present

## 2023-03-14 DIAGNOSIS — I10 Essential (primary) hypertension: Secondary | ICD-10-CM | POA: Diagnosis not present

## 2023-03-14 DIAGNOSIS — I38 Endocarditis, valve unspecified: Secondary | ICD-10-CM | POA: Diagnosis not present

## 2023-03-14 DIAGNOSIS — R001 Bradycardia, unspecified: Secondary | ICD-10-CM | POA: Diagnosis not present

## 2023-03-14 DIAGNOSIS — N1832 Chronic kidney disease, stage 3b: Secondary | ICD-10-CM | POA: Diagnosis not present

## 2023-03-21 DIAGNOSIS — M1711 Unilateral primary osteoarthritis, right knee: Secondary | ICD-10-CM | POA: Diagnosis not present

## 2023-03-23 DIAGNOSIS — M48062 Spinal stenosis, lumbar region with neurogenic claudication: Secondary | ICD-10-CM | POA: Diagnosis not present

## 2023-03-23 DIAGNOSIS — M62830 Muscle spasm of back: Secondary | ICD-10-CM | POA: Diagnosis not present

## 2023-03-23 DIAGNOSIS — M5126 Other intervertebral disc displacement, lumbar region: Secondary | ICD-10-CM | POA: Diagnosis not present

## 2023-03-23 DIAGNOSIS — M7541 Impingement syndrome of right shoulder: Secondary | ICD-10-CM | POA: Diagnosis not present

## 2023-03-23 DIAGNOSIS — Z79899 Other long term (current) drug therapy: Secondary | ICD-10-CM | POA: Diagnosis not present

## 2023-03-23 DIAGNOSIS — M5416 Radiculopathy, lumbar region: Secondary | ICD-10-CM | POA: Diagnosis not present

## 2023-04-04 DIAGNOSIS — M1711 Unilateral primary osteoarthritis, right knee: Secondary | ICD-10-CM | POA: Diagnosis not present

## 2023-05-08 DIAGNOSIS — M5416 Radiculopathy, lumbar region: Secondary | ICD-10-CM | POA: Diagnosis not present

## 2023-05-08 DIAGNOSIS — M5126 Other intervertebral disc displacement, lumbar region: Secondary | ICD-10-CM | POA: Diagnosis not present

## 2023-05-08 DIAGNOSIS — M7918 Myalgia, other site: Secondary | ICD-10-CM | POA: Diagnosis not present

## 2023-05-08 DIAGNOSIS — M48062 Spinal stenosis, lumbar region with neurogenic claudication: Secondary | ICD-10-CM | POA: Diagnosis not present

## 2023-05-10 ENCOUNTER — Ambulatory Visit: Admitting: Dermatology

## 2023-05-10 DIAGNOSIS — Z7189 Other specified counseling: Secondary | ICD-10-CM

## 2023-05-10 DIAGNOSIS — L300 Nummular dermatitis: Secondary | ICD-10-CM | POA: Diagnosis not present

## 2023-05-10 DIAGNOSIS — I872 Venous insufficiency (chronic) (peripheral): Secondary | ICD-10-CM | POA: Diagnosis not present

## 2023-05-10 DIAGNOSIS — Z79899 Other long term (current) drug therapy: Secondary | ICD-10-CM

## 2023-05-10 DIAGNOSIS — H353231 Exudative age-related macular degeneration, bilateral, with active choroidal neovascularization: Secondary | ICD-10-CM | POA: Diagnosis not present

## 2023-05-10 MED ORDER — MOMETASONE FUROATE 0.1 % EX CREA: 1.0000 | TOPICAL_CREAM | CUTANEOUS | 1 refills | Status: AC

## 2023-05-10 NOTE — Patient Instructions (Addendum)
 Stasis Dermatitis  Stasis in the legs causes chronic leg swelling, which may result in itchy or painful rashes, skin discoloration, skin texture changes, and sometimes ulceration.  Recommend daily graduated compression hose/stockings- easiest to put on first thing in morning, remove at bedtime.  Elevate legs as much as possible. Avoid salt/sodium rich foods.   Start Cerave Anti Itch daily (over the counter) Start Mometasone cream twice a day for 2 weeks, then if still irritated decrease to once a day for 2 weeks then stop cream Recommend compression socks Recommend keeping legs elevated when sitting   For Nummular Dermatitis (Eczema) on back and chest Start Mometasone cream once daily until clear Continue Cerave Anti Itch once daily    Due to recent changes in healthcare laws, you may see results of your pathology and/or laboratory studies on MyChart before the doctors have had a chance to review them. We understand that in some cases there may be results that are confusing or concerning to you. Please understand that not all results are received at the same time and often the doctors may need to interpret multiple results in order to provide you with the best plan of care or course of treatment. Therefore, we ask that you please give us  2 business days to thoroughly review all your results before contacting the office for clarification. Should we see a critical lab result, you will be contacted sooner.   If You Need Anything After Your Visit  If you have any questions or concerns for your doctor, please call our main line at [phone] and press option 4 to reach your doctor's medical assistant. If no one answers, please leave a voicemail as directed and we will return your call as soon as possible. Messages left after 4 pm will be answered the following business day.   You may also send us  a message via MyChart. We typically respond to MyChart messages within 1-2 business days.  For  prescription refills, please ask your pharmacy to contact our office. Our fax number is [phone].  If you have an urgent issue when the clinic is closed that cannot wait until the next business day, you can page your doctor at the number below.    Please note that while we do our best to be available for urgent issues outside of office hours, we are not available 24/7.   If you have an urgent issue and are unable to reach us , you may choose to seek medical care at your doctor's office, retail clinic, urgent care center, or emergency room.  If you have a medical emergency, please immediately call 911 or go to the emergency department.  Pager Numbers  - Dr. Bary Likes: [phone]  - Dr. Annette Barters: [phone]  - Dr. Felipe Horton: [phone]   In the event of inclement weather, please call our main line at [phone] for an update on the status of any delays or closures.  Dermatology Medication Tips: Please keep the boxes that topical medications come in in order to help keep track of the instructions about where and how to use these. Pharmacies typically print the medication instructions only on the boxes and not directly on the medication tubes.   If your medication is too expensive, please contact our office at [phone] option 4 or send us  a message through MyChart.   We are unable to tell what your co-pay for medications will be in advance as this is different depending on your insurance coverage. However, we may be able to find  a substitute medication at lower cost or fill out paperwork to get insurance to cover a needed medication.   If a prior authorization is required to get your medication covered by your insurance company, please allow us  1-2 business days to complete this process.  Drug prices often vary depending on where the prescription is filled and some pharmacies may offer cheaper prices.  The website www.goodrx.com contains coupons for medications through different  pharmacies. The prices here do not account for what the cost may be with help from insurance (it may be cheaper with your insurance), but the website can give you the price if you did not use any insurance.  - You can print the associated coupon and take it with your prescription to the pharmacy.  - You may also stop by our office during regular business hours and pick up a GoodRx coupon card.  - If you need your prescription sent electronically to a different pharmacy, notify our office through [facility] or by phone at [phone] option 4.     Si Usted Necesita Algo Despus de Su Visita  Tambin puede enviarnos un mensaje a travs de Clinical cytogeneticist. Por lo general respondemos a los mensajes de MyChart en el transcurso de 1 a 2 das hbiles.  Para renovar recetas, por favor pida a su farmacia que se ponga en contacto con nuestra oficina. Franz Jacks de fax es [place] [phone].  Si tiene un asunto urgente cuando la clnica est cerrada y que no puede esperar hasta el siguiente da hbil, puede llamar/localizar a su doctor(a) al nmero que aparece a continuacin.   Por favor, tenga en cuenta que aunque hacemos todo lo posible para estar disponibles para asuntos urgentes fuera del horario de [place], no estamos disponibles las 24 horas del da, los 7 [address]  [address] de la [place].   Si tiene un problema urgente y no puede comunicarse con nosotros, puede optar por buscar atencin mdica  en el consultorio de su doctor(a), en una clnica privada, en un centro de atencin urgente o en una sala de emergencias.  Si tiene Engineer, drilling, por favor llame inmediatamente al 911 o vaya a la sala de emergencias.  Nmeros de bper  - Dr. Bary Likes: [phone]  - Dra. Annette Barters: [phone]  - Dr. Felipe Horton: [phone]   En caso de inclemencias del tiempo, por favor llame a Lajuan Pila principal al [phone] para una actualizacin sobre el [place] de cualquier retraso o cierre.  Consejos para la  medicacin en dermatologa: Por favor, guarde las cajas en las que vienen los medicamentos de uso tpico para ayudarle a seguir las instrucciones sobre dnde y cmo usarlos. Las farmacias generalmente imprimen las instrucciones del medicamento slo en las cajas y no directamente en los tubos del [place].   Si su medicamento es muy caro, por favor, pngase en contacto con Bettyjane Brunet llamando al [phone] y presione la opcin 4 o envenos un mensaje a travs de Clinical cytogeneticist.   No podemos decirle cul ser su copago por los medicamentos por adelantado ya que esto es diferente dependiendo de la cobertura de su seguro. Sin embargo, es posible que podamos encontrar un medicamento sustituto a Audiological scientist un formulario para que el seguro cubra el medicamento que se considera necesario.   Si se requiere una autorizacin previa para que su compaa de seguros [place] su medicamento, por favor permtanos de 1 a 2 das hbiles para completar este proceso.  Los precios de los medicamentos varan con frecuencia dependiendo  del lugar de dnde se surte la receta y alguna farmacias pueden ofrecer precios ms baratos.  El sitio web www.goodrx.com tiene cupones para medicamentos de Health and safety inspector. Los precios aqu no tienen en cuenta lo que podra costar con la ayuda del seguro (puede ser ms barato con su seguro), pero el sitio web puede darle el precio si no utiliz Tourist information centre manager.  - Puede imprimir el cupn correspondiente y llevarlo con su receta a la farmacia.  - Tambin puede pasar por nuestra oficina durante el horario de atencin regular y Education officer, museum una tarjeta de cupones de GoodRx.  - Si necesita que su receta se enve electrnicamente a una farmacia diferente, informe a nuestra oficina a travs de MyChart de [place] o por telfono llamando al [phone] y presione la opcin 4.

## 2023-05-10 NOTE — Progress Notes (Signed)
 Follow-Up Visit   Subjective  Madeline Cabrera is a 78 y.o. female who presents for the following: check R lower leg, 93m, burning, improving, hx of fall broke ankle ~1.5 months ago and had to wear a boot, otc cream, check chest itching comes and goes, pt has hx of Nummular derm, did not get Clobetasol  sol/cerave mix, using otc Cerave Anti Itch. The patient has spots, moles and lesions to be evaluated, some may be new or changing and the patient may have concern these could be cancer.   The following portions of the chart were reviewed this encounter and updated as appropriate: medications, allergies, medical history  Review of Systems:  No other skin or systemic complaints except as noted in HPI or Assessment and Plan.  Objective  Well appearing patient in no apparent distress; mood and affect are within normal limits.   A focused examination was performed of the following areas: R lower leg, chest, back  Relevant exam findings are noted in the Assessment and Plan.    Assessment & Plan   STASIS DERMATITIS R lower leg Exam: dusky erythema R pretibia, associated pitting edema   Chronic and persistent condition with duration or expected duration over one year. Condition is symptomatic/ bothersome to patient. Not currently at goal.    Stasis in the legs causes chronic leg swelling, which may result in itchy or painful rashes, skin discoloration, skin texture changes, and sometimes ulceration.  Recommend daily graduated compression hose/stockings- easiest to put on first thing in morning, remove at bedtime.  Elevate legs as much as possible. Avoid salt/sodium rich foods.  Treatment Plan: Continue Cerave Anti Itch cream qd sample given Start Mometasone cream bid for 2 weeks, then if still irritated decrease to qd for 2 weeks then d/c Recommend daily compression socks if able to put on with recent leg injury Recommend keeping legs elevated when sitting  Topical steroids (such as  triamcinolone , fluocinolone, fluocinonide, mometasone, clobetasol , halobetasol, betamethasone , hydrocortisone) can cause thinning and lightening of the skin if they are used for too long in the same area. Your physician has selected the right strength medicine for your problem and area affected on the body. Please use your medication only as directed by your physician to prevent side effects.   Nummular Dermatitis Chest, back Exam: Xerosis chest, back, mild erythema chest, pt states itches  Chronic and persistent condition with duration or expected duration over one year. Condition is symptomatic/ bothersome to patient. Not currently at goal.   Nummular dermatitis (eczema) is a chronic, relapsing, itchy rash that can significantly affect quality of life. It is often associated with dry skin and flares in the wintertime, and may require treatment with prescription topical anti-inflammatory medications, in addition to gentle skin care.  If there is associated atopic dermatitis and topicals are not working, then biologic injections may be necessary to clear rash and control symptoms.  Treatment Plan: Start Mometasone cream every day/bid to aa chest, shoulders, back, until clear Cont Cerave Anti Itch every day/bid Start mild soap, sample of Dove given  Recommend mild soap and moisturizing cream 1-2 times daily.  Gentle skin care handout provided.    Topical steroids (such as triamcinolone , fluocinolone, fluocinonide, mometasone, clobetasol , halobetasol, betamethasone , hydrocortisone) can cause thinning and lightening of the skin if they are used for too long in the same area. Your physician has selected the right strength medicine for your problem and area affected on the body. Please use your medication only as directed by  your physician to prevent side effects.     Return if symptoms worsen or fail to improve.  I, Rollie Clipper, RMA, am acting as scribe for Artemio Larry, MD .   Documentation:  I have reviewed the above documentation for accuracy and completeness, and I agree with the above.  Artemio Larry, MD

## 2023-06-02 DIAGNOSIS — M48062 Spinal stenosis, lumbar region with neurogenic claudication: Secondary | ICD-10-CM | POA: Diagnosis not present

## 2023-06-02 DIAGNOSIS — M5126 Other intervertebral disc displacement, lumbar region: Secondary | ICD-10-CM | POA: Diagnosis not present

## 2023-06-02 DIAGNOSIS — M5416 Radiculopathy, lumbar region: Secondary | ICD-10-CM | POA: Diagnosis not present

## 2023-06-21 DIAGNOSIS — M1711 Unilateral primary osteoarthritis, right knee: Secondary | ICD-10-CM | POA: Diagnosis not present

## 2023-06-26 ENCOUNTER — Telehealth: Payer: Self-pay

## 2023-06-26 NOTE — Telephone Encounter (Signed)
 Patient states that mometasone  0.1% cream hasn't been helpful for her lower legs, and she is unable to tolerate compression stockings because they cut into her skin. Please advise.

## 2023-06-27 NOTE — Telephone Encounter (Signed)
 Discussed with patient and appointment scheduled for follow up.

## 2023-07-04 ENCOUNTER — Ambulatory Visit: Admitting: Dermatology

## 2023-07-10 ENCOUNTER — Ambulatory Visit: Admitting: Dermatology

## 2023-07-20 DIAGNOSIS — S8001XA Contusion of right knee, initial encounter: Secondary | ICD-10-CM | POA: Diagnosis not present

## 2023-07-20 DIAGNOSIS — M1711 Unilateral primary osteoarthritis, right knee: Secondary | ICD-10-CM | POA: Diagnosis not present

## 2023-07-20 DIAGNOSIS — S82831A Other fracture of upper and lower end of right fibula, initial encounter for closed fracture: Secondary | ICD-10-CM | POA: Diagnosis not present

## 2023-07-27 ENCOUNTER — Ambulatory Visit: Admitting: Dermatology

## 2023-07-27 DIAGNOSIS — Z7189 Other specified counseling: Secondary | ICD-10-CM

## 2023-07-27 DIAGNOSIS — L821 Other seborrheic keratosis: Secondary | ICD-10-CM

## 2023-07-27 DIAGNOSIS — W908XXA Exposure to other nonionizing radiation, initial encounter: Secondary | ICD-10-CM | POA: Diagnosis not present

## 2023-07-27 DIAGNOSIS — Z79899 Other long term (current) drug therapy: Secondary | ICD-10-CM

## 2023-07-27 DIAGNOSIS — I781 Nevus, non-neoplastic: Secondary | ICD-10-CM

## 2023-07-27 DIAGNOSIS — I872 Venous insufficiency (chronic) (peripheral): Secondary | ICD-10-CM

## 2023-07-27 DIAGNOSIS — L578 Other skin changes due to chronic exposure to nonionizing radiation: Secondary | ICD-10-CM | POA: Diagnosis not present

## 2023-07-27 DIAGNOSIS — I8311 Varicose veins of right lower extremity with inflammation: Secondary | ICD-10-CM

## 2023-07-27 MED ORDER — TACROLIMUS 0.1 % EX OINT: TOPICAL_OINTMENT | CUTANEOUS | 6 refills | Status: DC

## 2023-07-27 NOTE — Progress Notes (Signed)
 Follow-Up Visit   Subjective  Madeline Cabrera is a 78 y.o. female who presents for the following:  Patient here with daughter today who also contributes to history  patient reports broke ankle 5 months ago and she has developed some discoloration at right lower leg and states is painful.   Patient seen by Dr. Jackquline in May hx of stasis dermatitis at right lower leg and given rx of mometasone  cream to use bid for 2 weeks. Recommended compression stockings and leg elevation. Cerave Anti Itch cream.  Patient reports mometasone  0.1 % cream has not been helpful.  Patient reports she is unable to wear compression stockings they are too difficult to put on and hurt also cut into her skin. She tries to keep legs elevated as much as she can.    The following portions of the chart were reviewed this encounter and updated as appropriate: medications, allergies, medical history  Review of Systems:  No other skin or systemic complaints except as noted in HPI or Assessment and Plan.  Objective  Well appearing patient in no apparent distress; mood and affect are within normal limits.   A focused examination was performed of the following areas: B/l lower legs  Relevant exam findings are noted in the Assessment and Plan.      Assessment & Plan   STASIS DERMATITIS R lower leg Exam: dusky erythema R pretibia, associated pitting edema  see photos  Chronic and persistent condition with duration or expected duration over one year. Condition is symptomatic/ bothersome to patient. Not currently at goal.  Stasis in the legs causes chronic leg swelling, which may result in itchy or painful rashes, skin discoloration, skin texture changes, and sometimes ulceration.  Recommend daily graduated compression hose/stockings- easiest to put on first thing in morning, remove at bedtime.  Elevate legs as much as possible. Avoid salt/sodium rich foods.   Treatment Plan: Stop mometasone  0.1 % cream  Start  tacrolimus  0.1% ointment - apply topically to aa's of right lower leg bid prn  Patient advised to call or send mychart in a few week if she doesn't like cream will send rx of Eucrisa ointment   Continue Cerave Anti Itch cream  Recommend elevation when sitting   Topical steroids (such as triamcinolone , fluocinolone, fluocinonide, mometasone , clobetasol , halobetasol, betamethasone , hydrocortisone) can cause thinning and lightening of the skin if they are used for too long in the same area. Your physician has selected the right strength medicine for your problem and area affected on the body. Please use your medication only as directed by your physician to prevent side effects.   SEBORRHEIC KERATOSIS - Stuck-on, waxy, tan-brown papules and/or plaques  - Benign-appearing - Discussed benign etiology and prognosis. - Observe - Call for any changes  Varicose Veins/Spider Veins - Dilated blue, purple or red veins at the lower extremities - Reassured - Smaller vessels can be treated by sclerotherapy (a procedure to inject a medicine into the veins to make them disappear) if desired, but the treatment is not covered by insurance. Larger vessels may be covered if symptomatic and we would refer to vascular surgeon if treatment desired. Patient reports she is bothered by veins.  Offered referral to Vascular surgeon consult. Patient declined referral    ACTINIC DAMAGE - chronic, secondary to cumulative UV radiation exposure/sun exposure over time - diffuse scaly erythematous macules with underlying dyspigmentation - Recommend daily broad spectrum sunscreen SPF 30+ to sun-exposed areas, reapply every 2 hours as needed.  -  Recommend staying in the shade or wearing long sleeves, sun glasses (UVA+UVB protection) and wide brim hats (4-inch brim around the entire circumference of the hat). - Call for new or changing lesions.  STASIS DERMATITIS   Related Medications tacrolimus  (PROTOPIC ) 0.1 %  ointment Apply topically to affected area of right lower leg twice daily for stasis dermatitis  Return for 6 - 8 month stasis dermatitis follow up at right leg.  IEleanor Blush, CMA, am acting as scribe for Alm Rhyme, MD.   Documentation: I have reviewed the above documentation for accuracy and completeness, and I agree with the above.  Alm Rhyme, MD

## 2023-07-27 NOTE — Patient Instructions (Addendum)
 Stasis in the legs causes chronic leg swelling, which may result in itchy or painful rashes, skin discoloration, skin texture changes, and sometimes ulceration.  Recommend daily graduated compression hose/stockings- easiest to put on first thing in morning, remove at bedtime.  Elevate legs as much as possible. Avoid salt/sodium rich foods.  Stop mometasone  0.1 cream   Start Tacrolimus  ointment - apply topically to affected areas at right lower leg twice daily as needed   Send mychart or call in a month if topical is not helping can prescribe different cream  Continue Cerave Anti Itch cream to affected areas   Recommend keeping legs elevated when sitting    Due to recent changes in healthcare laws, you may see results of your pathology and/or laboratory studies on MyChart before the doctors have had a chance to review them. We understand that in some cases there may be results that are confusing or concerning to you. Please understand that not all results are received at the same time and often the doctors may need to interpret multiple results in order to provide you with the best plan of care or course of treatment. Therefore, we ask that you please give us  2 business days to thoroughly review all your results before contacting the office for clarification. Should we see a critical lab result, you will be contacted sooner.   If You Need Anything After Your Visit  If you have any questions or concerns for your doctor, please call our main line at [phone] and press option 4 to reach your doctor's medical assistant. If no one answers, please leave a voicemail as directed and we will return your call as soon as possible. Messages left after 4 pm will be answered the following business day.   You may also send us  a message via MyChart. We typically respond to MyChart messages within 1-2 business days.  For prescription refills, please ask your pharmacy to contact our office. Our fax number is  [phone].  If you have an urgent issue when the clinic is closed that cannot wait until the next business day, you can page your doctor at the number below.    Please note that while we do our best to be available for urgent issues outside of office hours, we are not available 24/7.   If you have an urgent issue and are unable to reach us , you may choose to seek medical care at your doctor's office, retail clinic, urgent care center, or emergency room.  If you have a medical emergency, please immediately call 911 or go to the emergency department.  Pager Numbers  - Dr. Hester: [phone]  - Dr. Jackquline: [phone]  - Dr. Claudene: [phone]   In the event of inclement weather, please call our main line at [phone] for an update on the status of any delays or closures.  Dermatology Medication Tips: Please keep the boxes that topical medications come in in order to help keep track of the instructions about where and how to use these. Pharmacies typically print the medication instructions only on the boxes and not directly on the medication tubes.   If your medication is too expensive, please contact our office at [phone] option 4 or send us  a message through MyChart.   We are unable to tell what your co-pay for medications will be in advance as this is different depending on your insurance coverage. However, we may be able to find a substitute medication at lower cost or fill out paperwork  to get insurance to cover a needed medication.   If a prior authorization is required to get your medication covered by your insurance company, please allow us  1-2 business days to complete this process.  Drug prices often vary depending on where the prescription is filled and some pharmacies may offer cheaper prices.  The website www.goodrx.com contains coupons for medications through different pharmacies. The prices here do not account for what the cost may be with help from  insurance (it may be cheaper with your insurance), but the website can give you the price if you did not use any insurance.  - You can print the associated coupon and take it with your prescription to the pharmacy.  - You may also stop by our office during regular business hours and pick up a GoodRx coupon card.  - If you need your prescription sent electronically to a different pharmacy, notify our office through [facility] or by phone at [phone] option 4.     Si Usted Necesita Algo Despus de Su Visita  Tambin puede enviarnos un mensaje a travs de Clinical cytogeneticist. Por lo general respondemos a los mensajes de MyChart en el transcurso de 1 a 2 das hbiles.  Para renovar recetas, por favor pida a su farmacia que se ponga en contacto con nuestra oficina. Randi lakes de fax es [place] [phone].  Si tiene un asunto urgente cuando la clnica est cerrada y que no puede esperar hasta el siguiente da hbil, puede llamar/localizar a su doctor(a) al nmero que aparece a continuacin.   Por favor, tenga en cuenta que aunque hacemos todo lo posible para estar disponibles para asuntos urgentes fuera del horario de [place], no estamos disponibles las 24 horas del da, los 7 [address]  [address] de la [place].   Si tiene un problema urgente y no puede comunicarse con nosotros, puede optar por buscar atencin mdica  en el consultorio de su doctor(a), en una clnica privada, en un centro de atencin urgente o en una sala de emergencias.  Si tiene Engineer, drilling, por favor llame inmediatamente al 911 o vaya a la sala de emergencias.  Nmeros de bper  - Dr. Hester: [phone]  - Dra. Jackquline: [phone]  - Dr. Claudene: [phone]   En caso de inclemencias del tiempo, por favor llame a landry capes principal al [phone] para una actualizacin sobre el [place] de cualquier retraso o cierre.  Consejos para la medicacin en dermatologa: Por favor, guarde las cajas en las que vienen los  medicamentos de uso tpico para ayudarle a seguir las instrucciones sobre dnde y cmo usarlos. Las farmacias generalmente imprimen las instrucciones del medicamento slo en las cajas y no directamente en los tubos del [place].   Si su medicamento es muy caro, por favor, pngase en contacto con landry rieger llamando al [phone] y presione la opcin 4 o envenos un mensaje a travs de Clinical cytogeneticist.   No podemos decirle cul ser su copago por los medicamentos por adelantado ya que esto es diferente dependiendo de la cobertura de su seguro. Sin embargo, es posible que podamos encontrar un medicamento sustituto a Audiological scientist un formulario para que el seguro cubra el medicamento que se considera necesario.   Si se requiere una autorizacin previa para que su compaa de seguros malta su medicamento, por favor permtanos de 1 a 2 das hbiles para completar este proceso.  Los precios de los medicamentos varan con frecuencia dependiendo del Environmental consultant de dnde se surte la receta y [place]  farmacias pueden ofrecer precios ms baratos.  El sitio web www.goodrx.com tiene cupones para medicamentos de Health and safety inspector. Los precios aqu no tienen en cuenta lo que podra costar con la ayuda del seguro (puede ser ms barato con su seguro), pero el sitio web puede darle el precio si no utiliz Tourist information centre manager.  - Puede imprimir el cupn correspondiente y llevarlo con su receta a la farmacia.  - Tambin puede pasar por nuestra oficina durante el horario de atencin regular y Education officer, museum una tarjeta de cupones de GoodRx.  - Si necesita que su receta se enve electrnicamente a una farmacia diferente, informe a nuestra oficina a travs de MyChart de [place] o por telfono llamando al [phone] y presione la opcin 4.

## 2023-08-01 ENCOUNTER — Encounter: Payer: Self-pay | Admitting: Dermatology

## 2023-08-11 DIAGNOSIS — S82831D Other fracture of upper and lower end of right fibula, subsequent encounter for closed fracture with routine healing: Secondary | ICD-10-CM | POA: Diagnosis not present

## 2023-08-11 DIAGNOSIS — M1711 Unilateral primary osteoarthritis, right knee: Secondary | ICD-10-CM | POA: Diagnosis not present

## 2023-08-11 DIAGNOSIS — M25561 Pain in right knee: Secondary | ICD-10-CM | POA: Diagnosis not present

## 2023-08-14 ENCOUNTER — Other Ambulatory Visit: Payer: Self-pay | Admitting: Orthopedic Surgery

## 2023-08-14 DIAGNOSIS — M48062 Spinal stenosis, lumbar region with neurogenic claudication: Secondary | ICD-10-CM | POA: Diagnosis not present

## 2023-08-14 DIAGNOSIS — M25561 Pain in right knee: Secondary | ICD-10-CM

## 2023-08-14 DIAGNOSIS — M1711 Unilateral primary osteoarthritis, right knee: Secondary | ICD-10-CM

## 2023-08-14 DIAGNOSIS — M5126 Other intervertebral disc displacement, lumbar region: Secondary | ICD-10-CM | POA: Diagnosis not present

## 2023-08-14 DIAGNOSIS — M62830 Muscle spasm of back: Secondary | ICD-10-CM | POA: Diagnosis not present

## 2023-08-14 DIAGNOSIS — S82831D Other fracture of upper and lower end of right fibula, subsequent encounter for closed fracture with routine healing: Secondary | ICD-10-CM

## 2023-08-14 DIAGNOSIS — M5380 Other specified dorsopathies, site unspecified: Secondary | ICD-10-CM | POA: Diagnosis not present

## 2023-08-14 DIAGNOSIS — Z79899 Other long term (current) drug therapy: Secondary | ICD-10-CM | POA: Diagnosis not present

## 2023-08-14 DIAGNOSIS — M5416 Radiculopathy, lumbar region: Secondary | ICD-10-CM | POA: Diagnosis not present

## 2023-08-15 ENCOUNTER — Ambulatory Visit
Admission: RE | Admit: 2023-08-15 | Discharge: 2023-08-15 | Disposition: A | Discharge status: Home / Self Care | Source: Ambulatory Visit | Attending: Orthopedic Surgery | Admitting: Orthopedic Surgery

## 2023-08-15 DIAGNOSIS — M25561 Pain in right knee: Secondary | ICD-10-CM | POA: Diagnosis not present

## 2023-08-15 DIAGNOSIS — S82831D Other fracture of upper and lower end of right fibula, subsequent encounter for closed fracture with routine healing: Secondary | ICD-10-CM | POA: Insufficient documentation

## 2023-08-15 DIAGNOSIS — M1711 Unilateral primary osteoarthritis, right knee: Secondary | ICD-10-CM | POA: Diagnosis not present

## 2023-08-15 DIAGNOSIS — M94261 Chondromalacia, right knee: Secondary | ICD-10-CM | POA: Diagnosis not present

## 2023-08-21 ENCOUNTER — Ambulatory Visit

## 2023-08-21 DIAGNOSIS — I872 Venous insufficiency (chronic) (peripheral): Secondary | ICD-10-CM

## 2023-08-21 DIAGNOSIS — L814 Other melanin hyperpigmentation: Secondary | ICD-10-CM

## 2023-08-21 DIAGNOSIS — D692 Other nonthrombocytopenic purpura: Secondary | ICD-10-CM | POA: Diagnosis not present

## 2023-08-21 MED ORDER — EUCRISA 2 % EX OINT: TOPICAL_OINTMENT | CUTANEOUS | 0 refills | Status: AC

## 2023-08-21 NOTE — Progress Notes (Signed)
   Follow-Up Visit   Subjective  Madeline Cabrera is a 78 y.o. female who presents for the following: Stasis dermatitis, R & L lower leg, tried Tacrolimus  and did not help, pt currently using an otc cream for itchy bumps, legs burning, pt tried compression socks but made legs hurt, burn, itch. Pt more concerned about burning/stinging sensation more so than darker discoloration. Has also tried topical steroids in past. Recently broke both ankles.    The following portions of the chart were reviewed this encounter and updated as appropriate: medications, allergies, medical history  Review of Systems:  No other skin or systemic complaints except as noted in HPI or Assessment and Plan.  Objective  Well appearing patient in no apparent distress; mood and affect are within normal limits.  A focused examination was performed of the following areas: Lower legs  Relevant exam findings are noted in the Assessment and Plan.    Assessment & Plan    STASIS DERMATITIS - Prior treatments: Mometasone  0.1% cream, tacrolimus  0.1% ointment.  - Exam: Erythematous, scaly patches involving the ankle and distal lower leg with associated lower leg edema. Chronic and persistent condition with duration or expected duration over one year. Condition is symptomatic/ bothersome to patient. Not currently at goal.  Stasis in the legs causes chronic leg swelling, which may result in itchy or painful rashes, skin discoloration, skin texture changes, and sometimes ulceration.  Recommend daily graduated compression hose/stockings- easiest to put on first thing in morning, remove at bedtime.  Elevate legs as much as possible. Avoid salt/sodium rich foods.  Treatment Plan: D/C Mometasone  0.1% cream and Tacrolimus  0.1% ointment.  Start Eucrisa  2% ointment BID.  Recommend OTC Sarna lotion.  Recommend compression stockings daily as tolerated.  Recommend elevating legs about the heart when sitting.  Discussed Hydrocodone   may exacerbate itching sensation. Discussed discoloration may persist   Actinic/solar purpura - Chronic; persistent and recurrent.  Treatable, but not curable. - Violaceous macules and patches - Benign - Related to trauma, age, sun damage and/or use of blood thinners, chronic use of topical and/or oral steroids - Observe - Can use OTC arnica containing moisturizer such as Dermend Bruise Formula if desired - Call for worsening or other concerns  Solar LENTIGINES Exam: scattered tan macules Due to sun exposure Treatment Plan: Benign-appearing, observe. Recommend daily broad spectrum sunscreen SPF 30+ to sun-exposed areas, reapply every 2 hours as needed.  Call for any changes   Return in about 3 months (around 11/21/2023) for stasis dermatitis follow up.  LILLETTE Rosina Mayans, CMA, am acting as scribe for Lauraine JAYSON Kanaris, MD .   Documentation: I have reviewed the above documentation for accuracy and completeness, and I agree with the above.  Lauraine JAYSON Kanaris, MD

## 2023-08-21 NOTE — Patient Instructions (Addendum)
 Discontinue Mometasone  0.1% cream and Tacrolimus  0.1% ointment.  Start Eucrisa  2% ointment twice daily. Recommend over the counter Sarna lotion.  Recommend compression stockings daily as tolerated once ankles have healed from fall. Recommend elevating legs about the heart when sitting.   Due to recent changes in healthcare laws, you may see results of your pathology and/or laboratory studies on MyChart before the doctors have had a chance to review them. We understand that in some cases there may be results that are confusing or concerning to you. Please understand that not all results are received at the same time and often the doctors may need to interpret multiple results in order to provide you with the best plan of care or course of treatment. Therefore, we ask that you please give us  2 business days to thoroughly review all your results before contacting the office for clarification. Should we see a critical lab result, you will be contacted sooner.   If You Need Anything After Your Visit  If you have any questions or concerns for your doctor, please call our main line at [phone] and press option 4 to reach your doctor's medical assistant. If no one answers, please leave a voicemail as directed and we will return your call as soon as possible. Messages left after 4 pm will be answered the following business day.   You may also send us  a message via MyChart. We typically respond to MyChart messages within 1-2 business days.  For prescription refills, please ask your pharmacy to contact our office. Our fax number is [phone].  If you have an urgent issue when the clinic is closed that cannot wait until the next business day, you can page your doctor at the number below.    Please note that while we do our best to be available for urgent issues outside of office hours, we are not available 24/7.   If you have an urgent issue and are unable to reach us , you may choose to seek medical  care at your doctor's office, retail clinic, urgent care center, or emergency room.  If you have a medical emergency, please immediately call 911 or go to the emergency department.  Pager Numbers  - Dr. Hester: [phone]  - Dr. Jackquline: [phone]  - Dr. Claudene: [phone]   In the event of inclement weather, please call our main line at [phone] for an update on the status of any delays or closures.  Dermatology Medication Tips: Please keep the boxes that topical medications come in in order to help keep track of the instructions about where and how to use these. Pharmacies typically print the medication instructions only on the boxes and not directly on the medication tubes.   If your medication is too expensive, please contact our office at [phone] option 4 or send us  a message through MyChart.   We are unable to tell what your co-pay for medications will be in advance as this is different depending on your insurance coverage. However, we may be able to find a substitute medication at lower cost or fill out paperwork to get insurance to cover a needed medication.   If a prior authorization is required to get your medication covered by your insurance company, please allow us  1-2 business days to complete this process.  Drug prices often vary depending on where the prescription is filled and some pharmacies may offer cheaper prices.  The website www.goodrx.com contains coupons for medications through different pharmacies. The prices here do not  account for what the cost may be with help from insurance (it may be cheaper with your insurance), but the website can give you the price if you did not use any insurance.  - You can print the associated coupon and take it with your prescription to the pharmacy.  - You may also stop by our office during regular business hours and pick up a GoodRx coupon card.  - If you need your prescription sent electronically to a different  pharmacy, notify our office through [employer] or by phone at [phone] option 4.     Si Usted Necesita Algo Despus de Su Visita  Tambin puede enviarnos un mensaje a travs de Clinical cytogeneticist. Por lo general respondemos a los mensajes de MyChart en el transcurso de 1 a 2 das hbiles.  Para renovar recetas, por favor pida a su farmacia que se ponga en contacto con nuestra oficina. Randi lakes de fax es [place] [phone].  Si tiene un asunto urgente cuando la clnica est cerrada y que no puede esperar hasta el siguiente da hbil, puede llamar/localizar a su doctor(a) al nmero que aparece a continuacin.   Por favor, tenga en cuenta que aunque hacemos todo lo posible para estar disponibles para asuntos urgentes fuera del horario de [place], no estamos disponibles las 24 horas del da, los 7 [address]  [address] de la [place].   Si tiene un problema urgente y no puede comunicarse con nosotros, puede optar por buscar atencin mdica  en el consultorio de su doctor(a), en una clnica privada, en un centro de atencin urgente o en una sala de emergencias.  Si tiene Engineer, drilling, por favor llame inmediatamente al 911 o vaya a la sala de emergencias.  Nmeros de bper  - Dr. Hester: [phone]  - Dra. Jackquline: [phone]  - Dr. Claudene: [phone]   En caso de inclemencias del tiempo, por favor llame a landry capes principal al [phone] para una actualizacin sobre el [place] de cualquier retraso o cierre.  Consejos para la medicacin en dermatologa: Por favor, guarde las cajas en las que vienen los medicamentos de uso tpico para ayudarle a seguir las instrucciones sobre dnde y cmo usarlos. Las farmacias generalmente imprimen las instrucciones del medicamento slo en las cajas y no directamente en los tubos del [place].   Si su medicamento es muy caro, por favor, pngase en contacto con landry rieger llamando al [phone] y presione la opcin 4 o envenos un mensaje a  travs de Clinical cytogeneticist.   No podemos decirle cul ser su copago por los medicamentos por adelantado ya que esto es diferente dependiendo de la cobertura de su seguro. Sin embargo, es posible que podamos encontrar un medicamento sustituto a Audiological scientist un formulario para que el seguro cubra el medicamento que se considera necesario.   Si se requiere una autorizacin previa para que su compaa de seguros malta su medicamento, por favor permtanos de 1 a 2 das hbiles para completar este proceso.  Los precios de los medicamentos varan con frecuencia dependiendo del Environmental consultant de dnde se surte la receta y alguna farmacias pueden ofrecer precios ms baratos.  El sitio web www.goodrx.com tiene cupones para medicamentos de Health and safety inspector. Los precios aqu no tienen en cuenta lo que podra costar con la ayuda del seguro (puede ser ms barato con su seguro), pero el sitio web puede darle el precio si no utiliz Tourist information centre manager.  - Puede imprimir el cupn correspondiente y llevarlo con su receta a la farmacia.  -  Tambin puede pasar por nuestra oficina durante el horario de atencin regular y Education officer, museum una tarjeta de cupones de GoodRx.  - Si necesita que su receta se enve electrnicamente a una farmacia diferente, informe a nuestra oficina a travs de MyChart de [place] o por telfono llamando al [phone] y presione la opcin 4.

## 2023-09-05 DIAGNOSIS — N1832 Chronic kidney disease, stage 3b: Secondary | ICD-10-CM | POA: Diagnosis not present

## 2023-09-05 DIAGNOSIS — R7303 Prediabetes: Secondary | ICD-10-CM | POA: Diagnosis not present

## 2023-09-05 DIAGNOSIS — I6523 Occlusion and stenosis of bilateral carotid arteries: Secondary | ICD-10-CM | POA: Diagnosis not present

## 2023-09-06 DIAGNOSIS — M48062 Spinal stenosis, lumbar region with neurogenic claudication: Secondary | ICD-10-CM | POA: Diagnosis not present

## 2023-09-06 DIAGNOSIS — M5126 Other intervertebral disc displacement, lumbar region: Secondary | ICD-10-CM | POA: Diagnosis not present

## 2023-09-06 DIAGNOSIS — M5416 Radiculopathy, lumbar region: Secondary | ICD-10-CM | POA: Diagnosis not present

## 2023-09-11 DIAGNOSIS — R7303 Prediabetes: Secondary | ICD-10-CM | POA: Diagnosis not present

## 2023-09-11 DIAGNOSIS — G720 Drug-induced myopathy: Secondary | ICD-10-CM | POA: Diagnosis not present

## 2023-09-11 DIAGNOSIS — F319 Bipolar disorder, unspecified: Secondary | ICD-10-CM | POA: Diagnosis not present

## 2023-09-11 DIAGNOSIS — T466X5A Adverse effect of antihyperlipidemic and antiarteriosclerotic drugs, initial encounter: Secondary | ICD-10-CM | POA: Diagnosis not present

## 2023-09-11 DIAGNOSIS — I2510 Atherosclerotic heart disease of native coronary artery without angina pectoris: Secondary | ICD-10-CM | POA: Diagnosis not present

## 2023-09-11 DIAGNOSIS — F0390 Unspecified dementia without behavioral disturbance: Secondary | ICD-10-CM | POA: Diagnosis not present

## 2023-09-11 DIAGNOSIS — N1832 Chronic kidney disease, stage 3b: Secondary | ICD-10-CM | POA: Diagnosis not present

## 2023-09-13 DIAGNOSIS — H353231 Exudative age-related macular degeneration, bilateral, with active choroidal neovascularization: Secondary | ICD-10-CM | POA: Diagnosis not present

## 2023-09-14 DIAGNOSIS — N1832 Chronic kidney disease, stage 3b: Secondary | ICD-10-CM | POA: Diagnosis not present

## 2023-09-14 DIAGNOSIS — T466X5A Adverse effect of antihyperlipidemic and antiarteriosclerotic drugs, initial encounter: Secondary | ICD-10-CM | POA: Diagnosis not present

## 2023-09-14 DIAGNOSIS — R001 Bradycardia, unspecified: Secondary | ICD-10-CM | POA: Diagnosis not present

## 2023-09-14 DIAGNOSIS — I2510 Atherosclerotic heart disease of native coronary artery without angina pectoris: Secondary | ICD-10-CM | POA: Diagnosis not present

## 2023-09-14 DIAGNOSIS — I6523 Occlusion and stenosis of bilateral carotid arteries: Secondary | ICD-10-CM | POA: Diagnosis not present

## 2023-09-14 DIAGNOSIS — G720 Drug-induced myopathy: Secondary | ICD-10-CM | POA: Diagnosis not present

## 2023-09-14 DIAGNOSIS — I10 Essential (primary) hypertension: Secondary | ICD-10-CM | POA: Diagnosis not present

## 2023-09-14 DIAGNOSIS — I447 Left bundle-branch block, unspecified: Secondary | ICD-10-CM | POA: Diagnosis not present

## 2023-10-16 DIAGNOSIS — Z79899 Other long term (current) drug therapy: Secondary | ICD-10-CM | POA: Diagnosis not present

## 2023-10-16 DIAGNOSIS — M48062 Spinal stenosis, lumbar region with neurogenic claudication: Secondary | ICD-10-CM | POA: Diagnosis not present

## 2023-10-16 DIAGNOSIS — M5126 Other intervertebral disc displacement, lumbar region: Secondary | ICD-10-CM | POA: Diagnosis not present

## 2023-10-16 DIAGNOSIS — M7918 Myalgia, other site: Secondary | ICD-10-CM | POA: Diagnosis not present

## 2023-10-16 DIAGNOSIS — M5416 Radiculopathy, lumbar region: Secondary | ICD-10-CM | POA: Diagnosis not present

## 2023-11-08 DIAGNOSIS — M48062 Spinal stenosis, lumbar region with neurogenic claudication: Secondary | ICD-10-CM | POA: Diagnosis not present

## 2023-11-08 DIAGNOSIS — M7541 Impingement syndrome of right shoulder: Secondary | ICD-10-CM | POA: Diagnosis not present

## 2023-11-08 DIAGNOSIS — M62830 Muscle spasm of back: Secondary | ICD-10-CM | POA: Diagnosis not present

## 2023-11-08 DIAGNOSIS — M5416 Radiculopathy, lumbar region: Secondary | ICD-10-CM | POA: Diagnosis not present

## 2023-11-08 DIAGNOSIS — M5126 Other intervertebral disc displacement, lumbar region: Secondary | ICD-10-CM | POA: Diagnosis not present

## 2023-11-17 DIAGNOSIS — J209 Acute bronchitis, unspecified: Secondary | ICD-10-CM | POA: Diagnosis not present

## 2023-11-22 ENCOUNTER — Ambulatory Visit

## 2023-12-13 DIAGNOSIS — M76822 Posterior tibial tendinitis, left leg: Secondary | ICD-10-CM | POA: Diagnosis not present

## 2023-12-13 DIAGNOSIS — M722 Plantar fascial fibromatosis: Secondary | ICD-10-CM | POA: Diagnosis not present

## 2023-12-13 DIAGNOSIS — L601 Onycholysis: Secondary | ICD-10-CM | POA: Diagnosis not present

## 2023-12-13 DIAGNOSIS — B351 Tinea unguium: Secondary | ICD-10-CM | POA: Diagnosis not present

## 2023-12-13 DIAGNOSIS — L600 Ingrowing nail: Secondary | ICD-10-CM | POA: Diagnosis not present

## 2024-01-24 ENCOUNTER — Ambulatory Visit: Admitting: Dermatology
